# Patient Record
Sex: Male | Born: 1965 | Race: Black or African American | Hispanic: No | Marital: Single | Smoking: Never smoker
Health system: Southern US, Community
[De-identification: ages and names within clinical notes are randomized; demographics above are authoritative.]

## PROBLEM LIST (undated history)

## (undated) DIAGNOSIS — N289 Disorder of kidney and ureter, unspecified: Secondary | ICD-10-CM

## (undated) DIAGNOSIS — I2510 Atherosclerotic heart disease of native coronary artery without angina pectoris: Secondary | ICD-10-CM

## (undated) DIAGNOSIS — I10 Essential (primary) hypertension: Secondary | ICD-10-CM

## (undated) DIAGNOSIS — Z9989 Dependence on other enabling machines and devices: Secondary | ICD-10-CM

## (undated) DIAGNOSIS — G4733 Obstructive sleep apnea (adult) (pediatric): Secondary | ICD-10-CM

## (undated) DIAGNOSIS — J42 Unspecified chronic bronchitis: Secondary | ICD-10-CM

## (undated) DIAGNOSIS — E785 Hyperlipidemia, unspecified: Secondary | ICD-10-CM

## (undated) DIAGNOSIS — B20 Human immunodeficiency virus [HIV] disease: Secondary | ICD-10-CM

## (undated) DIAGNOSIS — I219 Acute myocardial infarction, unspecified: Secondary | ICD-10-CM

## (undated) DIAGNOSIS — N184 Chronic kidney disease, stage 4 (severe): Secondary | ICD-10-CM

## (undated) DIAGNOSIS — I5032 Chronic diastolic (congestive) heart failure: Secondary | ICD-10-CM

---

## 2012-06-20 DIAGNOSIS — I219 Acute myocardial infarction, unspecified: Secondary | ICD-10-CM

## 2012-06-20 HISTORY — DX: Acute myocardial infarction, unspecified: I21.9

## 2012-06-20 HISTORY — PX: CORONARY ANGIOPLASTY WITH STENT PLACEMENT: SHX49

## 2015-11-11 ENCOUNTER — Emergency Department: Payer: Medicare Other

## 2015-11-11 ENCOUNTER — Emergency Department
Admission: EM | Admit: 2015-11-11 | Discharge: 2015-11-11 | Disposition: A | Discharge status: Home / Self Care | Payer: Medicare Other | Attending: Emergency Medicine | Admitting: Emergency Medicine

## 2015-11-11 ENCOUNTER — Emergency Department (EMERGENCY_DEPARTMENT_HOSPITAL)
Admission: EM | Admit: 2015-11-11 | Discharge: 2015-11-11 | Disposition: A | Discharge status: Home / Self Care | Payer: Medicare Other | Source: Home / Self Care | Attending: Emergency Medicine | Admitting: Emergency Medicine

## 2015-11-11 DIAGNOSIS — I2510 Atherosclerotic heart disease of native coronary artery without angina pectoris: Secondary | ICD-10-CM | POA: Insufficient documentation

## 2015-11-11 DIAGNOSIS — R600 Localized edema: Secondary | ICD-10-CM | POA: Insufficient documentation

## 2015-11-11 DIAGNOSIS — R609 Edema, unspecified: Secondary | ICD-10-CM

## 2015-11-11 DIAGNOSIS — I252 Old myocardial infarction: Secondary | ICD-10-CM | POA: Insufficient documentation

## 2015-11-11 DIAGNOSIS — R079 Chest pain, unspecified: Secondary | ICD-10-CM | POA: Insufficient documentation

## 2015-11-11 HISTORY — DX: Atherosclerotic heart disease of native coronary artery without angina pectoris: I25.10

## 2015-11-11 HISTORY — DX: Disorder of kidney and ureter, unspecified: N28.9

## 2015-11-11 LAB — CBC AND DIFFERENTIAL
Basophils Absolute Automated: 0.02 10*3/uL (ref 0.00–0.20)
Basophils Absolute Automated: 0.02 10*3/uL (ref 0.00–0.20)
Basophils Automated: 1 %
Basophils Automated: 1 %
Eosinophils Absolute Automated: 0.15 10*3/uL (ref 0.00–0.70)
Eosinophils Absolute Automated: 0.11 10*3/uL (ref 0.00–0.70)
Eosinophils Automated: 5 %
Eosinophils Automated: 3 %
Hematocrit: 32.1 % — ABNORMAL LOW (ref 42.0–52.0)
Hematocrit: 33.2 % — ABNORMAL LOW (ref 42.0–52.0)
Hgb: 11 g/dL — ABNORMAL LOW (ref 13.0–17.0)
Hgb: 10.9 g/dL — ABNORMAL LOW (ref 13.0–17.0)
Immature Granulocytes Absolute: 0 10*3/uL
Immature Granulocytes Absolute: 0 10*3/uL
Immature Granulocytes: 0 %
Immature Granulocytes: 0 %
Lymphocytes Absolute Automated: 0.98 10*3/uL (ref 0.50–4.40)
Lymphocytes Absolute Automated: 1.17 10*3/uL (ref 0.50–4.40)
Lymphocytes Automated: 30 %
Lymphocytes Automated: 36 %
MCH: 29.5 pg (ref 28.0–32.0)
MCH: 28.4 pg (ref 28.0–32.0)
MCHC: 34.3 g/dL (ref 32.0–36.0)
MCHC: 32.8 g/dL (ref 32.0–36.0)
MCV: 86.1 fL (ref 80.0–100.0)
MCV: 86.5 fL (ref 80.0–100.0)
MPV: 11.1 fL (ref 9.4–12.3)
MPV: 9.7 fL (ref 9.4–12.3)
Monocytes Absolute Automated: 0.26 10*3/uL (ref 0.00–1.20)
Monocytes Absolute Automated: 0.29 10*3/uL (ref 0.00–1.20)
Monocytes: 8 %
Monocytes: 9 %
Neutrophils Absolute: 1.82 10*3/uL (ref 1.80–8.10)
Neutrophils Absolute: 1.63 10*3/uL — ABNORMAL LOW (ref 1.80–8.10)
Neutrophils: 56 %
Neutrophils: 51 %
Nucleated RBC: 0 /100 WBC (ref 0–1)
Nucleated RBC: 0 /100 WBC (ref 0–1)
Platelets: 171 10*3/uL (ref 140–400)
Platelets: 149 10*3/uL (ref 140–400)
RBC: 3.73 10*6/uL — ABNORMAL LOW (ref 4.70–6.00)
RBC: 3.84 10*6/uL — ABNORMAL LOW (ref 4.70–6.00)
RDW: 15 % (ref 12–15)
RDW: 15 % (ref 12–15)
WBC: 3.23 10*3/uL — ABNORMAL LOW (ref 3.50–10.80)
WBC: 3.22 10*3/uL — ABNORMAL LOW (ref 3.50–10.80)

## 2015-11-11 LAB — BASIC METABOLIC PANEL
BUN: 24 mg/dL (ref 9.0–28.0)
BUN: 22 mg/dL (ref 9.0–28.0)
CO2: 21 mEq/L — ABNORMAL LOW (ref 22–29)
CO2: 24 mEq/L (ref 22–29)
Calcium: 8.6 mg/dL (ref 8.5–10.5)
Calcium: 8.7 mg/dL (ref 8.5–10.5)
Chloride: 110 mEq/L (ref 100–111)
Chloride: 111 mEq/L (ref 100–111)
Creatinine: 1.6 mg/dL — ABNORMAL HIGH (ref 0.7–1.3)
Creatinine: 1.5 mg/dL — ABNORMAL HIGH (ref 0.7–1.3)
Glucose: 95 mg/dL (ref 70–100)
Glucose: 111 mg/dL — ABNORMAL HIGH (ref 70–100)
Potassium: 5.3 mEq/L — ABNORMAL HIGH (ref 3.5–5.1)
Potassium: 4.8 mEq/L (ref 3.5–5.1)
Sodium: 136 mEq/L (ref 136–145)
Sodium: 138 mEq/L (ref 136–145)

## 2015-11-11 LAB — ECG 12-LEAD
Atrial Rate: 71 {beats}/min
P Axis: 16 degrees
P-R Interval: 178 ms
Q-T Interval: 364 ms
QRS Duration: 84 ms
QTC Calculation (Bezet): 395 ms
R Axis: 47 degrees
T Axis: 60 degrees
Ventricular Rate: 71 {beats}/min

## 2015-11-11 LAB — B-TYPE NATRIURETIC PEPTIDE
B-Natriuretic Peptide: 235 pg/mL — ABNORMAL HIGH (ref 0–100)
B-Natriuretic Peptide: 232 pg/mL — ABNORMAL HIGH (ref 0–100)

## 2015-11-11 LAB — GFR
EGFR: 45.9
EGFR: 59.9

## 2015-11-11 LAB — I-STAT TROPONIN: i-STAT Troponin: 0.02 ng/mL (ref 0.00–0.09)

## 2015-11-11 LAB — IHS D-DIMER: D-Dimer: 4.5 ug/mL FEU — ABNORMAL HIGH (ref 0.00–0.49)

## 2015-11-11 MED ORDER — FUROSEMIDE 20 MG PO TABS
20.0000 mg | ORAL_TABLET | Freq: Every day | ORAL | Status: AC
Start: 2015-11-11 — End: ?

## 2015-11-11 NOTE — ED Notes (Signed)
Pt becoming aggitated, requesting to speak to MD. RN explained to pt MD will be by to see pt as soon as possible, MD currently in w/ critical pt at this time. Pt states "but I don't care, I was here before them". Pt also informed awaiting [place] results at this time. Prior to interaction pt talking on phone & drinking soda, no apparent distress noted. VSS.

## 2015-11-11 NOTE — ED Notes (Signed)
Bed: S A  Expected date:   Expected time:   Means of arrival:   Comments:

## 2015-11-11 NOTE — Discharge Instructions (Signed)
Dear Mr. Franklin:    Thank you for choosing the [facility] Emergency Department, the premier emergency department in the [place] area.  I hope your visit today was EXCELLENT.    Specific instructions for your visit today:    Take Lasix, 1 tablet per day for leg swelling.  Please follow up with Dr. Erlene Senters for reevaluation.  Return to the ER for worsening swelling, shortness of breath, or fever.    Peripheral Edema NOS     You were diagnosed with peripheral edema.    Peripheral edema happens when a part of your body, most often your legs, starts to get swollen. The liquid that is supposed to be in your veins and arteries leaks out of them. It then enters the space between your muscles and fat cells. Peripheral edema is often painless, but it can be uncomfortable if the skin gets too tightly stretched.     A diagnosis of peripheral edema comes from a physical exam made by a doctor. Many things can cause peripheral edema. The most common are:     Congestive heart failure (inability of the heart to pump enough blood to your body).   Damage to your kidneys.   Injuries to a body part.   Liver failure.   Certain medicines.   Malnutrition (not eating enough nutritious food).    Pregnancy.     You can also get peripheral edema when you have been sitting or standing still for a long time. This can happen when you are in the hospital, during surgery, or during long car and plane trips. Your doctor might have done some blood work to see how your heart, liver, and kidneys are working. The doctor might have also done a chest x-ray or checked the pumping of your heart (EKG).     Swelling in the arms and hands is rare. This is often a sign of a more serious problem. Some examples are:      A squeezing of the superior vena cava. This is the big vein in your chest that gets blood from your head and arms.   A liver problem that lowers the amount of protein in your blood.    The tests done today did not  find a clear cause for your problem. You will need more tests to figure out what the cause is.    You are safe to go home. It is important to take medicines exactly as you are instructed.     We don't believe your condition is serious right now. But, it is important to be careful. Sometimes a problem that seems small can get serious later. This is why it is very important to come back here or go to the nearest Emergency Department if you don't get better or if your symptoms get worse.    When you get home, take all medicines as you were instructed. Avoid salt and extra fluids if your peripheral edema is caused by your heart. If your peripheral edema is serious, you might have been started on a diuretic. A diuretic, also called a "water pill," is a medicine that helps the kidneys remove extra water from your body.     Keep all appointments with your doctor. Following up with your doctor or the referral doctor is very important.    YOU SHOULD SEEK MEDICAL ATTENTION IMMEDIATELY, EITHER HERE OR AT THE NEAREST EMERGENCY DEPARTMENT, IF ANY OF THE FOLLOWING OCCUR:     You have vomiting (throwing up)  that stops you from keeping any fluids in your stomach.   You have a faster heart rate. You feel lightheaded, or you pass out (lose consciousness).   You are vomiting blood or you have serious chest pain after vomiting.   You have a fever (temperature higher than 100.2F or 38C), chills, or abdominal (belly) pain.    If you can't talk with your doctor, or if you feel you need to be rechecked, come back here or go to the nearest emergency department.      If you do not continue to improve or your condition worsens, please contact your doctor or return immediately to the Emergency Department.    Sincerely,  Druckenbrod, Channing Mutters, MD  Attending Emergency Physician  [facility] Emergency Department    ONSITE PHARMACY  Our full service onsite pharmacy is located in the ER waiting room.  Open 7 days a week from  9 am to 11 pm.  We accept all major insurances and prices are competitive with major retailers.  Ask your provider to print your prescriptions down to the pharmacy to speed you on your way home.    OBTAINING A PRIMARY CARE APPOINTMENT    Primary care physicians (PCPs, also known as primary care doctors) are either internists or family medicine doctors. Both types of PCPs focus on health promotion, disease prevention, patient education and counseling, and treatment of acute and chronic medical conditions.    Call for an appointment with a primary care doctor.  Ask to see who is taking new patients.     [facility]  telephone:  [phone]  [email or web address]    DOCTOR REFERRALS  Call [phone] (available 24 hours a day, 7 days a week) if you need any further referrals and we can help you find a primary care doctor or specialist.  Also, available online at:  [email or web address]    YOUR CONTACT INFORMATION  Before leaving please check with registration to make sure we have an up-to-date contact number.  You can call registration at [phone] to update your information.  For questions about your hospital bill, please call [phone].  For questions about your Emergency Dept Physician bill please call [phone].      FREE HEALTH SERVICES  If you need help with health or social services, please call 2-1-1 for a free referral to resources in your area.  2-1-1 is a free service connecting people with information on health insurance, free clinics, pregnancy, mental health, dental care, food assistance, housing, and substance abuse counseling.  Also, available online at:  http://www.[place].org    MEDICAL RECORDS AND TESTS  Certain laboratory test results do not come back the same day, for example urine cultures.   We will contact you if other important findings are noted.  Radiology films are often reviewed again to ensure accuracy.  If there is any discrepancy, we will notify  you.      Please call [phone] to pick up a complimentary CD of any radiology studies performed.  If you or your doctor would like to request a copy of your medical records, please call [phone].      ORTHOPEDIC INJURY   Please know that significant injuries can exist even when an initial x-ray is read as normal or negative.  This can occur because some fractures (broken bones) are not initially visible on x-rays.  For this reason, close outpatient follow-up with your primary care doctor or bone specialist (orthopedist) is required.    MEDICATIONS  AND FOLLOWUP  Please be aware that some prescription medications can cause drowsiness.  Use caution when driving or operating machinery.    The examination and treatment you have received in our Emergency Department is provided on an emergency basis, and is not intended to be a substitute for your primary care physician.  It is important that your doctor checks you again and that you report any new or remaining problems at that time.      24 HOUR PHARMACIES  The nearest 24 hour pharmacy is:    [facility]  [address]  [place], [place] [address]  [phone]      ASSISTANCE WITH INSURANCE    Affordable Care Act  [facility])  Call to start or finish an application, compare plans, enroll or ask a question.  [phone]  TTY: [phone]  Web:  Healthcare.gov    Help Enrolling in [facility]  [place]  407-669-6355 (TOLL-FREE)  657-882-4836 (TTY)  Web:  Http://www.coverva.org    Local Help Enrolling in the [facility]  [place] [facility]  [phone] (MAIN)  Email:  health-help@nvfs .org  Web:  BlackjackMyths.is  Address:  [address] [place], [place] [address]    SEDATING MEDICATIONS  Sedating medications include strong pain medications (e.g. narcotics), muscle relaxers, benzodiazepines (used for anxiety and as muscle relaxers), Benadryl/diphenhydramine and other antihistamines for allergic reactions/itching, and  other medications.  If you are unsure if you have received a sedating medication, please ask your physician or nurse.  If you received a sedating medication: DO NOT drive a car. DO NOT operate machinery. DO NOT perform jobs where you need to be alert.  DO NOT drink alcoholic beverages while taking this medicine.     If you get dizzy, sit or lie down at the first signs. Be careful going up and down stairs.  Be extra careful to prevent falls.     Never give this medicine to others.     Keep this medicine out of reach of children.     Do not take or save old medicines. Throw them away when outdated.     Keep all medicines in a cool, dry place. DO NOT keep them in your bathroom medicine cabinet or in a cabinet above the stove.    MEDICATION REFILLS  Please be aware that we cannot refill any prescriptions through the ER. If you need further treatment from what is provided at your ER visit, please follow up with your primary care doctor or your pain management specialist.    FREESTANDING EMERGENCY DEPARTMENTS OF [facility]  Did you know [facility] has two freestanding ERs located just a few miles away?  [facility] and [facility] [place] have short wait times, easy free parking directly in front of the building and top patient satisfaction scores - and the same Board Certified Emergency Medicine doctors as [facility].              HUNNER GARCON  [id number]  [id number]  [phone]  11/11/2015    Discharge Instructions    As always, you are the most important factor in your recovery.  Please follow these instructions carefully.  If you have problems that we have not discussed, CALL OR VISIT YOUR DOCTOR RIGHT AWAY.     If you can't reach your doctor, return to the emergency department.    I Horton Finer understand the written and discussed instructions.  My questions have been answered.  I acknowledge receipt of these instructions.     Patient or responsible person:          Patient's Signature               Physician or Nurse

## 2015-11-11 NOTE — Discharge Instructions (Signed)
Take 1 Aspirin daily.     Chest Pain of Unclear Etiology    You have been seen for chest pain. The cause of your pain is not yet known.    Your doctor has learned about your medical history, examined you, and checked any tests that were done. Still, it is unclear why you are having pain. The doctor thinks there is only a very small chance that your pain is caused by a life-threatening condition. Later, your primary care doctor might do more tests or check you again.    Sometimes chest pain is caused by a dangerous condition, like a heart attack, aorta injury, blood clot in the lung, or collapsed lung. It is unlikely that your pain is caused by a life-threatening condition if: Your chest pain lasts only a few seconds at a time; you are not short of breath, nauseated (sick to your stomach), sweaty, or lightheaded; your pain gets worse when you twist or bend; your pain improves with exercise or hard work.    Chest pain is serious. It is VERY IMPORTANT that you follow up with your regular doctor and seek medical attention immediately here or at the nearest Emergency Department if your symptoms become worse or they change.    YOU SHOULD SEEK MEDICAL ATTENTION IMMEDIATELY, EITHER HERE OR AT THE NEAREST EMERGENCY DEPARTMENT, IF ANY OF THE FOLLOWING OCCURS:   Your pain gets worse.   Your pain makes you short of breath, nauseated, or sweaty.   Your pain gets worse when you walk, go up stairs, or exert yourself.   You feel weak, lightheaded, or faint.   It hurts to breathe.   Your leg swells.   Your symptoms get worse or you have new symptoms or concerns.          Dear [name]:    Thank you for choosing the [facility] Emergency Department, the premier emergency department in the [place] area.  I hope your visit today was EXCELLENT.    Specific instructions for your visit today:        If you do not continue to improve or your condition worsens, please contact your doctor or return immediately to the  Emergency Department.    Sincerely,  Zachary Bradshaw, Zachary Bradshaw  Attending Emergency Physician  [facility] Emergency Department    ONSITE PHARMACY  Our full service onsite pharmacy is located in the ER waiting room.  Open 7 days a week from 9 am to 11 pm.  We accept all major insurances and prices are competitive with major retailers.  Ask your provider to print your prescriptions down to the pharmacy to speed you on your way home.    OBTAINING A PRIMARY CARE APPOINTMENT    Primary care physicians (PCPs, also known as primary care doctors) are either internists or family medicine doctors. Both types of PCPs focus on health promotion, disease prevention, patient education and counseling, and treatment of acute and chronic medical conditions.    Call for an appointment with a primary care doctor.  Ask to see who is taking new patients.     [facility]  telephone:  [phone]  [email or web address]    DOCTOR REFERRALS  Call [phone] (available 24 hours a day, 7 days a week) if you need any further referrals and we can help you find a primary care doctor or specialist.  Also, available online at:  [email or web address]    YOUR CONTACT INFORMATION  Before leaving please check with  registration to make sure we have an up-to-date contact number.  You can call registration at [phone] to update your information.  For questions about your hospital bill, please call [phone].  For questions about your Emergency Dept Physician bill please call [phone].      [place]  If you need help with health or social services, please call 2-1-1 for a free referral to resources in your area.  2-1-1 is a free service connecting people with information on health insurance, free clinics, pregnancy, mental health, dental care, food assistance, housing, and substance abuse counseling.  Also, available online at:  http://www.[place].org    MEDICAL RECORDS AND TESTS  Certain  laboratory test results do not come back the same day, for example urine cultures.   We will contact you if other important findings are noted.  Radiology films are often reviewed again to ensure accuracy.  If there is any discrepancy, we will notify you.      Please call [phone] to pick up a complimentary CD of any radiology studies performed.  If you or your doctor would like to request a copy of your medical records, please call [phone].      ORTHOPEDIC INJURY   Please know that significant injuries can exist even when an initial x-ray is read as normal or negative.  This can occur because some fractures (broken bones) are not initially visible on x-rays.  For this reason, close outpatient follow-up with your primary care doctor or bone specialist (orthopedist) is required.    MEDICATIONS AND FOLLOWUP  Please be aware that some prescription medications can cause drowsiness.  Use caution when driving or operating machinery.    The examination and treatment you have received in our Emergency Department is provided on an emergency basis, and is not intended to be a substitute for your primary care physician.  It is important that your doctor checks you again and that you report any new or remaining problems at that time.      [place]  The nearest 24 hour pharmacy is:    [facility] [place] [place] [address]  [place] Act  [facility])  Call to start or finish an application, compare plans, enroll or ask a question.  Speculator: [phone]  Web:  Healthcare.gov    Help Enrolling in [place]  949-644-6173 (TOLL-FREE)  [phone] (TTY)  Web:  Http://www.coverva.org    Local Help Enrolling in the [place]  217-763-4965 (MAIN)  Email:  health-help_0 .org  Web:  http://lewis-perez.info/  Address:  [address], [address] [place], [place]  [address]    SEDATING MEDICATIONS  Sedating medications include strong pain medications (e.g. narcotics), muscle relaxers, benzodiazepines (used for anxiety and as muscle relaxers), Benadryl/diphenhydramine and other antihistamines for allergic reactions/itching, and other medications.  If you are unsure if you have received a sedating medication, please ask your physician or nurse.  If you received a sedating medication: DO NOT drive a car. DO NOT operate machinery. DO NOT perform jobs where you need to be alert.  DO NOT drink alcoholic beverages while taking this medicine.     If you get dizzy, sit or lie down at the first signs. Be careful going up and down stairs.  Be extra careful to prevent falls.     Never give this  medicine to others.     Keep this medicine out of reach of children.     Do not take or save old medicines. Throw them away when outdated.     Keep all medicines in a cool, dry place. DO NOT keep them in your bathroom medicine cabinet or in a cabinet above the stove.    MEDICATION REFILLS  Please be aware that we cannot refill any prescriptions through the ER. If you need further treatment from what is provided at your ER visit, please follow up with your primary care doctor or your pain management specialist.    FREESTANDING EMERGENCY DEPARTMENTS OF [facility]  Did you know [facility] has two freestanding ERs located just a few miles away?  [place] [facility] [place] and [place] [facility] [place] have short wait times, easy free parking directly in front of the building and top patient satisfaction scores - and the same Board Certified Emergency Medicine doctors as [facility].              Zachary Bradshaw  [id number]  [id number]  [phone]  11/11/2015    Discharge Instructions    As always, you are the most important factor in your recovery.  Please follow these instructions carefully.  If you have problems that we have not discussed, CALL OR VISIT YOUR DOCTOR RIGHT AWAY.     If you  can't reach your doctor, return to the emergency department.    I Zachary Bradshaw understand the written and discussed instructions.  My questions have been answered.  I acknowledge receipt of these instructions.     Patient or responsible person:         Patient's Signature               Physician or Nurse

## 2015-11-11 NOTE — ED Notes (Signed)
I have briefly evaluated this patient as triage physician in order to facilitate and initiate the ordering of laboratory and imaging studies as needed.      Latanya Presser, MD  11/11/15 [phone]

## 2015-11-11 NOTE — ED Provider Notes (Signed)
Physician/Midlevel provider first contact with patient: 11/11/15 0909             History     Chief Complaint   Patient presents with   . Leg Swelling       HPI  Zachary Bradshaw is a 50 y.o. male h/o chronic bilateral LE swelling and pain, gout, renal failure (7 years ago, never underwent dialysis), MI s/p cardiac catheterization (2015), alcohol abuse (staying at the [facility] for the next 6 months); c/o bilateral LE swelling x2 days. He reports onset of L big toe pain yesterday, came here for further eval. Denies h/o CHF.      History  Past Medical History   Diagnosis Date   . Kidney disorder    . Coronary artery disease        History reviewed. No pertinent past surgical history.    No family history on file.    Social History     Social History   . Marital Status: Single     Spouse Name: N/A   . Number of Children: N/A   . Years of Education: N/A     Occupational History   . Not on file.     Social History Main Topics   . Smoking status: Never Smoker    . Smokeless tobacco: Not on file   . Alcohol Use: No   . Drug Use: Not on file   . Sexual Activity: Not on file     Other Topics Concern   . Not on file     Social History Narrative   . No narrative on file       Allergies   Allergen Reactions   . Sulfa Antibiotics    . Zithromax [Azithromycin]        Discharge Medication List as of 11/11/2015  1:58 PM      CONTINUE these medications which have NOT CHANGED    Details   LISINOPRIL PO Take by mouth., Until Discontinued, Historical Med              ROS  Positive and negative ROS elements as per HPI.  All other systems reviewed and negative.    Interpretations  O2 sat-           saturation: 99 %; Oxygen use: room air; Interpretation: Normal    EKG -             interpreted by me: NSR at 71, nl EKG.    Physical Exam    Pulse 95  BP (!) 128/91 mmHg  Resp 16  SpO2 99 %  Temp 97.2 F (36.2 C)    Physical Exam   Constitutional: He is oriented to person, place, and time and well-developed, well-nourished, and  in no distress. No distress.   HENT:   Head: Normocephalic and atraumatic.   Mouth/Throat: Oropharynx is clear and moist. No oropharyngeal exudate.   Eyes: Conjunctivae and EOM are normal. Pupils are equal, round, and reactive to light. Right eye exhibits no discharge. Left eye exhibits no discharge.   Neck: Normal range of motion. Neck supple. No JVD present.   Cardiovascular: Normal rate and regular rhythm.    No murmur heard.  Pulmonary/Chest: Effort normal and breath sounds normal. No respiratory distress. He has no wheezes.   Abdominal: Soft. Bowel sounds are normal. He exhibits no distension. There is no tenderness. There is no rebound.   Musculoskeletal:   Bilat 1-2+ pitting edema in [place].  Mild  erythema and tend at L MCP.  Sensory intact.   Neurological: He is alert and oriented to person, place, and time. No cranial nerve deficit. Coordination normal. GCS score is 15.   Skin: Skin is warm and dry. No rash noted. He is not diaphoretic. No erythema.   Psychiatric: Affect and judgment normal.       MDM and ED Course     Results     Procedure Component Value Units Date/Time    B-type Natriuretic Peptide [[id number]]  (Abnormal) Collected:  11/11/15 0951    Specimen Information:  Blood Updated:  11/11/15 1057     B-Natriuretic Peptide 235 (H) pg/mL     D-Dimer [[id number]]  (Abnormal) Collected:  11/11/15 0951     D-Dimer 4.50 (H) ug/mL FEU Updated:  11/11/15 1054    Basic Metabolic Panel [[id number]]  (Abnormal) Collected:  11/11/15 0951    Specimen Information:  Blood Updated:  11/11/15 1029     Glucose 95 mg/dL      BUN 08.6 mg/dL      Creatinine 1.6 (H) mg/dL      Calcium 8.6 mg/dL      Sodium 578 mEq/L      Potassium 5.3 (H) mEq/L      Chloride 110 mEq/L      CO2 21 (L) mEq/L     GFR [[id number]] Collected:  11/11/15 0951     EGFR 45.9 Updated:  11/11/15 1029    CBC with differential [[id number]]  (Abnormal) Collected:  11/11/15 0951    Specimen Information:  Blood from Blood Updated:  11/11/15 1011     WBC 3.23  (L) x10 3/uL      Hgb 11.0 (L) g/dL      Hematocrit 27.2 (L) %      Platelets 171 x10 3/uL      RBC 3.73 (L) x10 6/uL      MCV 86.1 fL      MCH 29.5 pg      MCHC 34.3 g/dL      RDW 15 %      MPV 11.1 fL      Neutrophils 56 %      Lymphocytes Automated 30 %      Monocytes 8 %      Eosinophils Automated 5 %      Basophils Automated 1 %      Immature Granulocyte 0 %      Nucleated RBC 0 /100 WBC      Neutrophils Absolute 1.82 x10 3/uL      Abs Lymph Automated 0.98 x10 3/uL      Abs Mono Automated 0.26 x10 3/uL      Abs Eos Automated 0.15 x10 3/uL      Absolute Baso Automated 0.02 x10 3/uL      Absolute Immature Granulocyte 0.00 x10 3/uL           [place] Venodopp Low Extremity Bilateral    11/11/2015   No evidence for DVT. Charlott Rakes, MD 11/11/2015 1:28 PM       Medications - No data to display    MDM  Mild periph edema with mild BNP elevation and cret.  DW Thathagari.  Begin low lasix and follow with her.  Dopplers negative.  NO CP or SOB  11:39 AM: Spoke w/ Dr Erlene Senters, aware of pt. Will follow up in outpatient.    Procedures      Clinical Impression & Disposition     Final diagnoses:  Peripheral edema          Discharge         Discharge Prescriptions     Medication Sig Dispense Auth. Provider    furosemide (LASIX) 20 MG tablet Take 1 tablet (20 mg total) by mouth daily. 14 tablet Lylia Karn, Channing Mutters, MD          Discharge Medication List as of 11/11/2015  1:58 PM      START taking these medications    Details   furosemide (LASIX) 20 MG tablet Take 1 tablet (20 mg total) by mouth daily., Starting 11/11/2015, Until Discontinued, Print             Attestations  I was acting as a Neurosurgeon for Orvan Papadakis, Channing Mutters, MD on [name],[name]  Treatment Team: Scribe: Valrie Hart    I am the first provider for this patient and I personally performed the services documented. Treatment Team: Scribe: Valrie Hart is scribing for me on [name],[name], Channing Mutters, MD    Rajiv Parlato, Channing Mutters, MD  11/12/15 [phone]

## 2015-11-11 NOTE — ED Provider Notes (Signed)
[facility] EMERGENCY DEPARTMENT H&P      Visit date: 11/11/2015      CLINICAL SUMMARY          Diagnosis:    .     Final diagnoses:   Chest pain, unspecified type         MDM Notes:      Chest pain - ill defined - over 4 hours ago - not suspicious for ACS - no associated symptoms.  'Will check labs, CXR, EKG.             Disposition:         Discharge         Discharge Prescriptions     None                      CLINICAL INFORMATION        HPI:      Chief Complaint: Chest Pain  .    Zachary Bradshaw is a 50 y.o. male with h/o gout, MI (2 years ago with catheterization, w/o stents) who presents with L sided CP as well as swelling and pain in his BLE onset 2 days prior. Her reports that sxs were similar to previous MI. Seen here earlier today for gout,  And then had some chest pain after going back to [facility] sevreal hours ago.  Sober for 2 weeks since he arrived at the [facility].  Hx MI 2 years ago but ? No stents and not taking any medication at this time.  NO Hx HTN, DM, cholesterol, is smoker.    Denies associated SOB, nausea, or diaphoresis.   Denies taking any cardiac medicine.     Of note, pt is staying at the [facility] for EtOH abuse. Admits to cocaine use two weeks ago, but denies heroin use.     History obtained from: Patient          ROS:      Positive and negative ROS elements as per HPI.  All other systems reviewed and negative.      Physical Exam:      Pulse 74  BP 136/74 mmHg  Resp 16  SpO2 100 %  Temp 98.4 F (36.9 C)    Physical Exam   Constitutional: He is oriented to person, place, and time. He appears well-developed and well-nourished.   WD male - alert, NAD   HENT:   Head: Normocephalic and atraumatic.   Neck: Normal range of motion. Neck supple. No JVD present. No tracheal deviation present. No thyromegaly present.   Cardiovascular: Normal rate, regular rhythm and intact distal pulses.  Exam reveals no gallop and no friction rub.    No  murmur heard.  Soft S4  No murmur   Pulmonary/Chest: Effort normal and breath sounds normal. No stridor. No respiratory distress. He has no wheezes. He has no rales. He exhibits no tenderness.   Clear bilat.   Abdominal: Soft. Bowel sounds are normal. He exhibits no distension and no mass. There is no tenderness. There is no rebound and no guarding.   Musculoskeletal: Normal range of motion. He exhibits edema. He exhibits no tenderness.   1 plus edema lower legs.     Lymphadenopathy:     He has no cervical adenopathy.   Neurological: He is alert and oriented to person, place, and time. No cranial nerve deficit. He exhibits normal muscle tone. Coordination normal.   Skin: Skin is warm. No rash noted.  No erythema. No pallor.   Psychiatric: He has a normal mood and affect. His behavior is normal. Judgment and thought content normal.   Nursing note and vitals reviewed.                 PAST HISTORY        Primary Care Provider: Patsy Lager, MD        PMH/PSH:    .     Past Medical History   Diagnosis Date   . Kidney disorder    . Coronary artery disease        He has no past surgical history on file.      Social/Family History:      He reports that he has never smoked. He does not have any smokeless tobacco history on file. He reports that he does not drink alcohol. His drug history is not on file.    History reviewed. No pertinent family history.      Listed Medications on Arrival:    .     Home Medications     Last Medication Reconciliation Action:  In Progress Abu-Sharakh, Nur-Elhuda, RN 11/11/2015  6:22 PM                  furosemide (LASIX) 20 MG tablet     Take 1 tablet (20 mg total) by mouth daily.     LISINOPRIL PO     Take by mouth.         Allergies: He is allergic to sulfa antibiotics and zithromax.            VISIT INFORMATION        Clinical Course in the ED:            Medications Given in the ED:    .     ED Medication Orders     None            Procedures:      Procedures      Interpretations:      O2  sat-           saturation: 100 %; Oxygen use: room air; Interpretation: Normal    EKG -             interpreted by me: Sinus rhythm at 76 bpm, no acute changes       Heart Score         Value    History  0    EKG  0    Risk Factors  0    Total (with age)  1    Onset of pain (time of START of last episode of chest pain)?  3-6 hrs ago    Timing of repeat Troponin/EKG order  6 hours after onset of pain (time of START of last episode)                 RESULTS        Lab Results:      Results     Procedure Component Value Units Date/Time    B-type Natriuretic Peptide (BNP) [[id number]]  (Abnormal) Collected:  11/11/15 2031    Specimen Information:  Blood Updated:  11/11/15 2107     B-Natriuretic Peptide 232 (H) pg/mL     Basic Metabolic Panel [[id number]]  (Abnormal) Collected:  11/11/15 1947    Specimen Information:  Blood Updated:  11/11/15 2013     Glucose 111 (H) mg/dL      BUN  22.0 mg/dL      Creatinine 1.5 (H) mg/dL      Calcium 8.7 mg/dL      Sodium 161 mEq/L      Potassium 4.8 mEq/L      Chloride 111 mEq/L      CO2 24 mEq/L     GFR [[id number]] Collected:  11/11/15 1947     EGFR 59.9 Updated:  11/11/15 2013    CBC with differential [[id number]]  (Abnormal) Collected:  11/11/15 1947    Specimen Information:  Blood from Blood Updated:  11/11/15 2003     WBC 3.22 (L) x10 3/uL      Hgb 10.9 (L) g/dL      Hematocrit 95.6 (L) %      Platelets 149 x10 3/uL      RBC 3.84 (L) x10 6/uL      MCV 86.5 fL      MCH 28.4 pg      MCHC 32.8 g/dL      RDW 15 %      MPV 9.7 fL      Neutrophils 51 %      Lymphocytes Automated 36 %      Monocytes 9 %      Eosinophils Automated 3 %      Basophils Automated 1 %      Immature Granulocyte 0 %      Nucleated RBC 0 /100 WBC      Neutrophils Absolute 1.63 (L) x10 3/uL      Abs Lymph Automated 1.17 x10 3/uL      Abs Mono Automated 0.29 x10 3/uL      Abs Eos Automated 0.11 x10 3/uL      Absolute Baso Automated 0.02 x10 3/uL      Absolute Immature Granulocyte 0.00 x10 3/uL     i-Stat Troponin  [[id number]] Collected:  11/11/15 1941     i-STAT Troponin 0.02 ng/mL Updated:  11/11/15 1958              Radiology Results:      XR Chest  AP Portable   Final Result         Mild cardiomegaly, otherwise no acute cardiopulmonary process      J. Carole Binning, MD    11/11/2015 7:27 PM                     Scribe Attestation:      I was acting as a Neurosurgeon for Maurine Minister, MD on Corum,Jarris A  Treatment Team: Scribe: Linford Arnold     I am the first provider for this patient and I personally performed the services documented. Treatment Team: Scribe: Linford Arnold is scribing for me on Meals,Bentley A. This note and the patient instructions accurately reflect work and decisions made by me.  Maurine Minister, MD       Note - labs show mildly elevated BNP, mild cardiomegaly, and edema - will refer to transitional care clinic for further evaluation, possible echo, and further treatment.  I do not suspect acute aCS at this time.    Maurine Minister, MD  11/14/15 1447

## 2015-11-11 NOTE — ED Notes (Signed)
Patient BIBA for chest pain, left sided. Took two nitro with no relief. 12 lead negative. Patient seen here and discharged a couple hours ago for gout.

## 2015-11-12 LAB — ECG 12-LEAD
Atrial Rate: 76 {beats}/min
P Axis: 0 degrees
P-R Interval: 172 ms
Q-T Interval: 356 ms
QRS Duration: 84 ms
QTC Calculation (Bezet): 400 ms
R Axis: 12 degrees
T Axis: 35 degrees
Ventricular Rate: 76 {beats}/min

## 2017-03-15 ENCOUNTER — Encounter (HOSPITAL_COMMUNITY): Payer: Self-pay | Admitting: *Deleted

## 2017-03-15 ENCOUNTER — Inpatient Hospital Stay (HOSPITAL_COMMUNITY)
Admission: AD | Admit: 2017-03-15 | Discharge: 2017-03-17 | DRG: 177 | Source: Other Acute Inpatient Hospital | Attending: Internal Medicine | Admitting: Internal Medicine

## 2017-03-15 DIAGNOSIS — Z7982 Long term (current) use of aspirin: Secondary | ICD-10-CM

## 2017-03-15 DIAGNOSIS — J9811 Atelectasis: Secondary | ICD-10-CM | POA: Diagnosis present

## 2017-03-15 DIAGNOSIS — R0902 Hypoxemia: Secondary | ICD-10-CM | POA: Diagnosis present

## 2017-03-15 DIAGNOSIS — I130 Hypertensive heart and chronic kidney disease with heart failure and stage 1 through stage 4 chronic kidney disease, or unspecified chronic kidney disease: Secondary | ICD-10-CM | POA: Diagnosis present

## 2017-03-15 DIAGNOSIS — Z955 Presence of coronary angioplasty implant and graft: Secondary | ICD-10-CM

## 2017-03-15 DIAGNOSIS — N179 Acute kidney failure, unspecified: Secondary | ICD-10-CM | POA: Diagnosis present

## 2017-03-15 DIAGNOSIS — D61818 Other pancytopenia: Secondary | ICD-10-CM | POA: Diagnosis present

## 2017-03-15 DIAGNOSIS — J189 Pneumonia, unspecified organism: Secondary | ICD-10-CM | POA: Diagnosis present

## 2017-03-15 DIAGNOSIS — E785 Hyperlipidemia, unspecified: Secondary | ICD-10-CM | POA: Diagnosis present

## 2017-03-15 DIAGNOSIS — Z8701 Personal history of pneumonia (recurrent): Secondary | ICD-10-CM

## 2017-03-15 DIAGNOSIS — I252 Old myocardial infarction: Secondary | ICD-10-CM

## 2017-03-15 DIAGNOSIS — N189 Chronic kidney disease, unspecified: Secondary | ICD-10-CM

## 2017-03-15 DIAGNOSIS — F17210 Nicotine dependence, cigarettes, uncomplicated: Secondary | ICD-10-CM | POA: Diagnosis present

## 2017-03-15 DIAGNOSIS — Z7902 Long term (current) use of antithrombotics/antiplatelets: Secondary | ICD-10-CM

## 2017-03-15 DIAGNOSIS — J869 Pyothorax without fistula: Secondary | ICD-10-CM | POA: Diagnosis present

## 2017-03-15 DIAGNOSIS — R079 Chest pain, unspecified: Secondary | ICD-10-CM | POA: Diagnosis present

## 2017-03-15 DIAGNOSIS — I2510 Atherosclerotic heart disease of native coronary artery without angina pectoris: Secondary | ICD-10-CM | POA: Diagnosis present

## 2017-03-15 DIAGNOSIS — I10 Essential (primary) hypertension: Secondary | ICD-10-CM | POA: Diagnosis not present

## 2017-03-15 DIAGNOSIS — N183 Chronic kidney disease, stage 3 (moderate): Secondary | ICD-10-CM | POA: Diagnosis present

## 2017-03-15 DIAGNOSIS — B20 Human immunodeficiency virus [HIV] disease: Secondary | ICD-10-CM | POA: Diagnosis present

## 2017-03-15 DIAGNOSIS — Z882 Allergy status to sulfonamides status: Secondary | ICD-10-CM

## 2017-03-15 DIAGNOSIS — D649 Anemia, unspecified: Secondary | ICD-10-CM | POA: Diagnosis present

## 2017-03-15 DIAGNOSIS — R161 Splenomegaly, not elsewhere classified: Secondary | ICD-10-CM | POA: Diagnosis present

## 2017-03-15 DIAGNOSIS — I5032 Chronic diastolic (congestive) heart failure: Secondary | ICD-10-CM | POA: Diagnosis present

## 2017-03-15 HISTORY — DX: Human immunodeficiency virus (HIV) disease: B20

## 2017-03-15 HISTORY — DX: Acute myocardial infarction, unspecified: I21.9

## 2017-03-15 LAB — TROPONIN I

## 2017-03-15 LAB — LACTIC ACID, PLASMA: Lactic Acid, Venous: 2.9 mmol/L (ref 0.5–1.9)

## 2017-03-15 LAB — PROCALCITONIN: Procalcitonin: <0.1 ng/mL

## 2017-03-15 MED ORDER — GUAIFENESIN ER 600 MG PO TB12
600.0000 mg | ORAL_TABLET | Freq: Two times a day (BID) | ORAL | Status: DC
  Administered 2017-03-15 – 2017-03-17 (×4): 600 mg via ORAL
  Filled 2017-03-15 (×4): qty 1

## 2017-03-15 MED ORDER — MORPHINE SULFATE (PF) 4 MG/ML IV SOLN
4.0000 mg | INTRAVENOUS | Status: AC
  Administered 2017-03-15: 4 mg via INTRAVENOUS
  Filled 2017-03-15: qty 1

## 2017-03-15 MED ORDER — HYDROCODONE-ACETAMINOPHEN 5-325 MG PO TABS
1.0000 | ORAL_TABLET | ORAL | Status: DC | PRN
  Administered 2017-03-16 (×2): 2 via ORAL
  Filled 2017-03-15 (×2): qty 2

## 2017-03-15 MED ORDER — IPRATROPIUM-ALBUTEROL 0.5-2.5 (3) MG/3ML IN SOLN: 3.0000 mL | RESPIRATORY_TRACT | Status: DC | PRN

## 2017-03-15 MED ORDER — CEFTRIAXONE SODIUM 2 G IJ SOLR
2.0000 g | INTRAMUSCULAR | Status: DC
  Administered 2017-03-15 – 2017-03-16 (×2): 2 g via INTRAVENOUS
  Filled 2017-03-15 (×3): qty 2

## 2017-03-15 MED ORDER — ACETAMINOPHEN 650 MG RE SUPP: 650.0000 mg | Freq: Four times a day (QID) | RECTAL | Status: DC | PRN

## 2017-03-15 MED ORDER — MORPHINE SULFATE (PF) 4 MG/ML IV SOLN
2.0000 mg | INTRAVENOUS | Status: DC | PRN
  Administered 2017-03-16 (×2): 2 mg via INTRAVENOUS
  Filled 2017-03-15 (×2): qty 1

## 2017-03-15 MED ORDER — SODIUM CHLORIDE 0.9 % IV BOLUS (SEPSIS)
500.0000 mL | Freq: Once | INTRAVENOUS | Status: AC
  Administered 2017-03-15: 500 mL via INTRAVENOUS

## 2017-03-15 MED ORDER — SODIUM CHLORIDE 0.9 % IV SOLN
INTRAVENOUS | Status: DC
  Administered 2017-03-15 – 2017-03-17 (×4): via INTRAVENOUS

## 2017-03-15 MED ORDER — ACETAMINOPHEN 325 MG PO TABS: 650.0000 mg | ORAL_TABLET | Freq: Four times a day (QID) | ORAL | Status: DC | PRN

## 2017-03-15 MED ORDER — ENOXAPARIN SODIUM 40 MG/0.4ML ~~LOC~~ SOLN
40.0000 mg | SUBCUTANEOUS | Status: DC
  Administered 2017-03-15 – 2017-03-16 (×2): 40 mg via SUBCUTANEOUS
  Filled 2017-03-15 (×2): qty 0.4

## 2017-03-15 NOTE — Progress Notes (Signed)
51 year old male with a history of HIV being transferred from [facility] ED for recurrent pneumonia suspected to be PCP pneumonia. Patient will need infectious disease evaluation

## 2017-03-15 NOTE — Progress Notes (Signed)
While doing nursing admission hx, pt requests that no mention of hiv status be made in front of prison guards or anyone in his room. He states he does not trust the prison guards. He told me this when guard stepped out into hall to change shifts with the oncoming guard. Lucius Conn BSN, RN-BC Admissions RN 03/15/2017 6:48 PM

## 2017-03-15 NOTE — Progress Notes (Signed)
LCSW aware of consult: patient from jail with planned disposition back to jail.  At this time, no interventions required by LCSW regarding this disposition.  Officers at bedside will facilitate DC.    Chart reviewed and aware of confidentiality of 042 requested: While doing nursing admission hx, pt requests that nothing be said about his hiv status in front of prison guards or anyone in his room. He states he does not trust the prison guards. Pt told me this when guard stepped out into hall to change shifts with oncoming guard.   Will alert on coming CSW for AM and make aware of planned disposition.  LCSW will follow acutely in case needs are warranted, however at this time none needed.  Joseph Quinn, MSW Clinical Social Work: [employer] Coverage for :  [phone]

## 2017-03-15 NOTE — Progress Notes (Addendum)
CRITICAL VALUE ALERT  Critical Value:  Lactic acid 2.9  Date & Time Notied:  03/15/2017  2103  Provider Notified: Lamar Blinks NP  Orders Received/Actions taken: 520mL NS Bolus

## 2017-03-15 NOTE — Progress Notes (Signed)
Pharmacy Antibiotic Note  Joseph Quinn is a 51 y.o. male with hx recurrent PNA, transferred from [facility] ED to [facility] ED on 03/15/2017 for evaluation and management of PNA.  To start ceftriaxone for suspected empyema.   Plan: - ceftriaxone 2gm IV q24h - no renal adjustment is needed with ceftriaxone-- pharmacy will sign off   ---------------------------------------------  Height: 5\' 8"  (172.7 cm) Weight: 156 lb 15.5 oz (71.2 kg) IBW/kg (Calculated) : 68.4  Temp (24hrs), Avg:99.4 F (37.4 C), Min:99.4 F (37.4 C), Max:99.4 F (37.4 C)  No results for input(s): WBC, CREATININE, LATICACIDVEN, VANCOTROUGH, VANCOPEAK, VANCORANDOM, GENTTROUGH, GENTPEAK, GENTRANDOM, TOBRATROUGH, TOBRAPEAK, TOBRARND, AMIKACINPEAK, AMIKACINTROU, AMIKACIN in the last 168 hours.  CrCl cannot be calculated (No order found.).    Allergies  Allergen Reactions  . Sulfa Antibiotics     Pt states it made him go into coma    Thank you for allowing pharmacy to be a part of this patient's care.  Lynelle Doctor 03/15/2017 8:12 PM

## 2017-03-15 NOTE — H&P (Addendum)
History and Physical    Bertrand Vowels GNF:[id number] DOB: 1966/01/19 DOA: 03/15/2017  Referring MD/NP/PA: Dr. Allyson Sabal PCP: No primary care provider on file.  Patient coming from: Correctional facility to [place] transferred to [facility] Complaint: Chest pain  HPI: Joseph Quinn is a 51 y.o. male with medical history significant of HIV/aids( last CD4 count noted to be 114), HTN, HLD, CAD s/p stent, chronic diastolic CHF; who presents with complaints of right-sided chest pain since last night at around 10 PM. Patient describes the pain as severe worsened with any inspiratory breath. Endorses having a cough for the last few days, but has not been able to cough anything up due to the pain. Associated symptoms include shortness of breath, nausea, nonbloody emesis 2 episodes, and diarrhea reporting more from 5-6, missed the last 3-4 days.  He had been hospitalized and diagnosed at [facility] with strep pneumonia bacteremia secondary to right lower lobe pneumonia on 9/4, and had been treated with high-dose Rocephin with vancomycin for 8 days and transitioned to Levaquin at discharge. Blood cultures were noted to have cleared and sensitivities to cultures were noted to be adequate. He was also started on dapsone during that visit for PCP prophylaxis due to his low CD4 counts. A transthoracic echocardiogram was obtained showed EF of approximately 55%. However, after being discharged back to prison patient reports not receiving these medications. He subsequently developed recurrent fevers and was admitted back into the hospital on 9/11- 9/17. At that time it appears blood cultures were negative, but he was started on Levaquin and prednisone and her fevers resolved in the first 24 hours. He underwent repeat transthoracic echocardiogram because note of a new murmur on physical exam previously noted. as well as transesophageal echocardiogram which appeared to be similar to previous  echocardiogram and no vegetation noted. Dr. Randall Hiss of [facility] ID was consulted and recommended changing antibiotics from Levaquin to Rocephin 2 g daily and continue dapsone. Patient completed 14 days of antibiotic therapy for bacteremia on 9/17, and was discharged back to the facility. Subsequently, patient was evaluated on 9/24 with CT angiogram at [facility] in [place] show no signs of a pulmonary embolus. Due to his recurrent symptoms of chest pain he was evaluated at [facility] where a CT angiogram was obtained and revealed a mildly loculated right pleural effusion noted posteriorly and 4 mm nodule in the left upper lobe. Labs revealed WBC 3.3, hemoglobin 10.8, hematocrit 33.0,   ED Course: As seen above  Review of Systems  Constitutional: Positive for malaise/fatigue. Negative for weight loss.  HENT: Negative for ear discharge and ear pain.   Eyes: Negative for double vision and photophobia.  Respiratory: Positive for cough and shortness of breath. Negative for hemoptysis.   Cardiovascular: Positive for chest pain. Negative for leg swelling.  Gastrointestinal: Positive for diarrhea, nausea and vomiting. Negative for blood in stool and constipation.  Genitourinary: Negative for dysuria and frequency.  Musculoskeletal: Positive for back pain. Negative for falls.  Skin: Negative for itching and rash.  Neurological: Negative for seizures and loss of consciousness.  Psychiatric/Behavioral: Negative for memory loss and substance abuse.    Past Medical History:  Diagnosis Date  . HIV (human immunodeficiency virus infection) ([place])   . Myocardial infarction ([place])    hx of 2 mi's per pt    Past Surgical History:  Procedure Laterality Date  . NO PAST SURGERIES       reports that he has been smoking Cigarettes.  He has a 10.00 pack-year smoking history. He has never used smokeless tobacco. He reports that he drinks alcohol. He reports that he does not use drugs.  Allergies  Allergen  Reactions  . Sulfa Antibiotics     Pt states it made him go into coma    History reviewed. No pertinent family history.  Prior to Admission medications   Not on File    Physical Exam:  Constitutional: NAD, calm, comfortable Vitals:   03/15/17 1745  BP: 140/86  Pulse: 74  Resp: 16  Temp: 99.4 F (37.4 C)  TempSrc: Oral  SpO2: 97%  Weight: 71.2 kg (156 lb 15.5 oz)  Height: 5\' 8"  (1.727 m)   Eyes: PERRL, lids and conjunctivae normal ENMT: Mucous membranes are moist. Posterior pharynx clear of any exudate or lesions.Normal dentition.  Neck: normal, supple, no masses, no thyromegaly Respiratory: Decreased overall aeration especially noted in the right lower lung field. Patient able to talk in complete sentences. Cardiovascular: Regular rate and rhythm, no murmurs / rubs / gallops. No extremity edema. 2+ pedal pulses. No carotid bruits.  Abdomen: no tenderness, no masses palpated. No hepatosplenomegaly. Bowel sounds positive.  Musculoskeletal: no clubbing / cyanosis. No joint deformity upper and lower extremities. Good ROM, no contractures. Normal muscle tone.  Skin: no rashes, lesions, ulcers. No induration Neurologic: CN 2-12 grossly intact. Sensation intact, DTR normal. Strength 5/5 in all 4.  Psychiatric: Normal judgment and insight. Alert and oriented x 3. Normal mood.     Labs on Admission: I have personally reviewed following labs and imaging studies  CBC: No results for input(s): WBC, NEUTROABS, HGB, HCT, MCV, PLT in the last 168 hours. Basic Metabolic Panel: No results for input(s): NA, K, CL, CO2, GLUCOSE, BUN, CREATININE, CALCIUM, MG, PHOS in the last 168 hours. GFR: CrCl cannot be calculated (No order found.). Liver Function Tests: No results for input(s): AST, ALT, ALKPHOS, BILITOT, PROT, ALBUMIN in the last 168 hours. No results for input(s): LIPASE, AMYLASE in the last 168 hours. No results for input(s): AMMONIA in the last 168 hours. Coagulation  Profile: No results for input(s): INR, PROTIME in the last 168 hours. Cardiac Enzymes: No results for input(s): CKTOTAL, CKMB, CKMBINDEX, TROPONINI in the last 168 hours. BNP (last 3 results) No results for input(s): PROBNP in the last 8760 hours. HbA1C: No results for input(s): HGBA1C in the last 72 hours. CBG: No results for input(s): GLUCAP in the last 168 hours. Lipid Profile: No results for input(s): CHOL, HDL, LDLCALC, TRIG, CHOLHDL, LDLDIRECT in the last 72 hours. Thyroid Function Tests: No results for input(s): TSH, T4TOTAL, FREET4, T3FREE, THYROIDAB in the last 72 hours. Anemia Panel: No results for input(s): VITAMINB12, FOLATE, FERRITIN, TIBC, IRON, RETICCTPCT in the last 72 hours. Urine analysis: No results found for: COLORURINE, APPEARANCEUR, LABSPEC, PHURINE, GLUCOSEU, HGBUR, BILIRUBINUR, KETONESUR, PROTEINUR, UROBILINOGEN, NITRITE, LEUKOCYTESUR Sepsis Labs: No results found for this or any previous visit (from the past 240 hour(s)).   Radiological Exams on Admission: No results found.  EKG: Independently reviewed. 1st degree Av block  Assessment/Plan Right-sided chest pain 2/2 Suspected Empyema: Acute. Patient complains of right sided chest pain especially with taking a deep inspiratory breath. CT angiogram at outside facility seen and chart reveals mild loculated right pleural effusion posteriorly with possible atelectasis on the right lower lung lobe. Patient was given 1 dose of Zosyn prior to transport. Given recent history of strep pneumonia pna with bacteremia question if symptoms related. - Admit to a telemetry bed -  Follow-up blood and sputum(if able to be obtained)cultures  - Rocephin 2 g daily per ID recommendation - Dr. Bobby Rumpf of infectious disease consulted, and will see in a.m.  Dyspnea and hypoxia: Acute. Patient reports having shortness of breath. initial ABG noted to be compensated with pH 7.36, the CO2 38, PO2 77.  - Continuous pulse oximetry  with nasal Oxygen as needed. - Duonebs prn SOB/Cough - Mucinex  HIV (human immunodeficiency virus infection): As noted CD4 count was 114, CD4 helper percentage 28.5 -continue darunavir, Tivicay, Norvir, and Descovy - Infectious disease consulted, follow-up for further recommendations    Pancytopenia: Patient presented to outside facility today and noted to have WBC 3.3, and hemoglobin 10.8. - check Lactic acid level - Recheck CBC in a.m.    Essential hypertension - Continue amlodipine and coreg  Possible Acute kidney injury on chronic kidney disease: Patient's last creatinine seen on records was previously 1.6 on 9/16. He presents with a creatinine of 1.8 on admission with BUN 23. - Check renal ultrasound - IV fluids normal saline at 062 ml/hr  Diastolic CHF: Last EF noted to be within normal limits 02/2017. - Strict ins and outs and daily weights  Coronary artery disease: Patient s/p stent placement   - continue Plavix and ASA  Hyperlipidemia - continue pravastatin  Tobacco abuse: Patient endorses still smoking cigarettes and declines nicotine patch. - Counseled on need of cessation of tobacco.   DVT prophylaxis: lovenox  Code Status: Full Family Communication: No family members present in room, only guard Disposition Plan: Likely discharge back to correctional facility once medically stable  Consults called: ID  Admission status: inpatient  Norval Morton MD [facility] Pager [phone]   If 7PM-7AM, please contact night-coverage [email or web address] Password [email or web address]  03/15/2017, 7:18 PM

## 2017-03-16 ENCOUNTER — Other Ambulatory Visit: Payer: Self-pay

## 2017-03-16 ENCOUNTER — Inpatient Hospital Stay (HOSPITAL_COMMUNITY)

## 2017-03-16 DIAGNOSIS — Z7982 Long term (current) use of aspirin: Secondary | ICD-10-CM

## 2017-03-16 DIAGNOSIS — Z79899 Other long term (current) drug therapy: Secondary | ICD-10-CM

## 2017-03-16 DIAGNOSIS — N189 Chronic kidney disease, unspecified: Secondary | ICD-10-CM

## 2017-03-16 DIAGNOSIS — D649 Anemia, unspecified: Secondary | ICD-10-CM | POA: Diagnosis present

## 2017-03-16 DIAGNOSIS — J869 Pyothorax without fistula: Principal | ICD-10-CM

## 2017-03-16 DIAGNOSIS — E785 Hyperlipidemia, unspecified: Secondary | ICD-10-CM | POA: Diagnosis present

## 2017-03-16 DIAGNOSIS — Z882 Allergy status to sulfonamides status: Secondary | ICD-10-CM

## 2017-03-16 DIAGNOSIS — J189 Pneumonia, unspecified organism: Secondary | ICD-10-CM

## 2017-03-16 DIAGNOSIS — I2510 Atherosclerotic heart disease of native coronary artery without angina pectoris: Secondary | ICD-10-CM | POA: Diagnosis present

## 2017-03-16 DIAGNOSIS — Z21 Asymptomatic human immunodeficiency virus [HIV] infection status: Secondary | ICD-10-CM

## 2017-03-16 DIAGNOSIS — F17210 Nicotine dependence, cigarettes, uncomplicated: Secondary | ICD-10-CM

## 2017-03-16 DIAGNOSIS — B20 Human immunodeficiency virus [HIV] disease: Secondary | ICD-10-CM

## 2017-03-16 DIAGNOSIS — I10 Essential (primary) hypertension: Secondary | ICD-10-CM | POA: Diagnosis present

## 2017-03-16 DIAGNOSIS — R161 Splenomegaly, not elsewhere classified: Secondary | ICD-10-CM

## 2017-03-16 DIAGNOSIS — J13 Pneumonia due to Streptococcus pneumoniae: Secondary | ICD-10-CM

## 2017-03-16 DIAGNOSIS — R079 Chest pain, unspecified: Secondary | ICD-10-CM | POA: Diagnosis present

## 2017-03-16 DIAGNOSIS — N179 Acute kidney failure, unspecified: Secondary | ICD-10-CM | POA: Diagnosis present

## 2017-03-16 LAB — MRSA PCR SCREENING: MRSA BY PCR: NEGATIVE

## 2017-03-16 LAB — COMPREHENSIVE METABOLIC PANEL
ALBUMIN: 2.4 g/dL — AB (ref 3.5–5.0)
ALT: 11 U/L — ABNORMAL LOW (ref 17–63)
ANION GAP: 6 (ref 5–15)
AST: 17 U/L (ref 15–41)
Alkaline Phosphatase: 91 U/L (ref 38–126)
BILIRUBIN TOTAL: 0.2 mg/dL — AB (ref 0.3–1.2)
BUN: 23 mg/dL — ABNORMAL HIGH (ref 6–20)
CO2: 21 mmol/L — AB (ref 22–32)
Calcium: 8.5 mg/dL — ABNORMAL LOW (ref 8.9–10.3)
Chloride: 108 mmol/L (ref 101–111)
Creatinine, Ser: 1.65 mg/dL — ABNORMAL HIGH (ref 0.61–1.24)
GFR calc Af Amer: 54 mL/min — ABNORMAL LOW (ref >60)
GFR calc non Af Amer: 47 mL/min — ABNORMAL LOW (ref >60)
GLUCOSE: 166 mg/dL — AB (ref 65–99)
POTASSIUM: 5.3 mmol/L — AB (ref 3.5–5.1)
SODIUM: 135 mmol/L (ref 135–145)
TOTAL PROTEIN: 6.8 g/dL (ref 6.5–8.1)

## 2017-03-16 LAB — CBC
HCT: 26.9 % — ABNORMAL LOW (ref 39.0–52.0)
Hemoglobin: 8.9 g/dL — ABNORMAL LOW (ref 13.0–17.0)
MCH: 25.8 pg — ABNORMAL LOW (ref 26.0–34.0)
MCHC: 33.1 g/dL (ref 30.0–36.0)
MCV: 78 fL (ref 78.0–100.0)
Platelets: 173 10*3/uL (ref 150–400)
RBC: 3.45 MIL/uL — ABNORMAL LOW (ref 4.22–5.81)
RDW: 15.6 % — AB (ref 11.5–15.5)
WBC: 2.5 10*3/uL — ABNORMAL LOW (ref 4.0–10.5)

## 2017-03-16 LAB — STREP PNEUMONIAE URINARY ANTIGEN: STREP PNEUMO URINARY ANTIGEN: POSITIVE — AB

## 2017-03-16 LAB — LACTIC ACID, PLASMA
Lactic Acid, Venous: 1.8 mmol/L (ref 0.5–1.9)
Lactic Acid, Venous: 3.3 mmol/L (ref 0.5–1.9)

## 2017-03-16 MED ORDER — SACCHAROMYCES BOULARDII 250 MG PO CAPS
250.0000 mg | ORAL_CAPSULE | Freq: Two times a day (BID) | ORAL | Status: DC
  Administered 2017-03-16 – 2017-03-17 (×3): 250 mg via ORAL
  Filled 2017-03-16 (×3): qty 1

## 2017-03-16 MED ORDER — HYDROCERIN EX CREA
1.0000 "application " | TOPICAL_CREAM | Freq: Every day | CUTANEOUS | Status: DC
  Filled 2017-03-16: qty 113

## 2017-03-16 MED ORDER — CLOPIDOGREL BISULFATE 75 MG PO TABS
75.0000 mg | ORAL_TABLET | Freq: Every day | ORAL | Status: DC
  Administered 2017-03-16 – 2017-03-17 (×2): 75 mg via ORAL
  Filled 2017-03-16 (×2): qty 1

## 2017-03-16 MED ORDER — EMTRICITABINE-TENOFOVIR AF 200-25 MG PO TABS
1.0000 | ORAL_TABLET | Freq: Every day | ORAL | Status: DC
  Administered 2017-03-16 – 2017-03-17 (×2): 1 via ORAL
  Filled 2017-03-16 (×2): qty 1

## 2017-03-16 MED ORDER — DIPHENHYDRAMINE HCL 25 MG PO CAPS
25.0000 mg | ORAL_CAPSULE | ORAL | Status: DC | PRN
  Administered 2017-03-16 – 2017-03-17 (×3): 25 mg via ORAL
  Filled 2017-03-16 (×3): qty 1

## 2017-03-16 MED ORDER — DIPHENHYDRAMINE HCL 50 MG PO CAPS
50.0000 mg | ORAL_CAPSULE | Freq: Once | ORAL | Status: AC
  Administered 2017-03-16: 50 mg via ORAL
  Filled 2017-03-16: qty 1

## 2017-03-16 MED ORDER — LACTULOSE 10 GM/15ML PO SOLN
30.0000 g | Freq: Once | ORAL | Status: AC
  Administered 2017-03-16: 30 g via ORAL
  Filled 2017-03-16: qty 45

## 2017-03-16 MED ORDER — ALUM & MAG HYDROXIDE-SIMETH 200-200-20 MG/5ML PO SUSP
30.0000 mL | ORAL | Status: DC | PRN
Start: 2017-03-16 — End: 2017-03-17
  Administered 2017-03-16 (×3): 30 mL via ORAL
  Filled 2017-03-16 (×3): qty 30

## 2017-03-16 MED ORDER — CARVEDILOL 6.25 MG PO TABS
6.2500 mg | ORAL_TABLET | Freq: Two times a day (BID) | ORAL | Status: DC
  Administered 2017-03-16 – 2017-03-17 (×3): 6.25 mg via ORAL
  Filled 2017-03-16 (×3): qty 1

## 2017-03-16 MED ORDER — AMLODIPINE BESYLATE 5 MG PO TABS
5.0000 mg | ORAL_TABLET | Freq: Every day | ORAL | Status: DC
  Administered 2017-03-16 – 2017-03-17 (×2): 5 mg via ORAL
  Filled 2017-03-16 (×2): qty 1

## 2017-03-16 MED ORDER — HYDROCORTISONE 2.5 % RE CREA
1.0000 "application " | TOPICAL_CREAM | Freq: Two times a day (BID) | RECTAL | Status: DC | PRN
  Filled 2017-03-16: qty 28.35

## 2017-03-16 MED ORDER — RITONAVIR 100 MG PO TABS
100.0000 mg | ORAL_TABLET | Freq: Every day | ORAL | Status: DC
  Administered 2017-03-16 – 2017-03-17 (×2): 100 mg via ORAL
  Filled 2017-03-16 (×2): qty 1

## 2017-03-16 MED ORDER — SODIUM CHLORIDE 0.9 % IV BOLUS (SEPSIS)
500.0000 mL | Freq: Once | INTRAVENOUS | Status: AC
  Administered 2017-03-16: 500 mL via INTRAVENOUS

## 2017-03-16 MED ORDER — DOLUTEGRAVIR SODIUM 50 MG PO TABS
50.0000 mg | ORAL_TABLET | Freq: Two times a day (BID) | ORAL | Status: DC
  Administered 2017-03-16 – 2017-03-17 (×3): 50 mg via ORAL
  Filled 2017-03-16 (×3): qty 1

## 2017-03-16 MED ORDER — FERROUS SULFATE 325 (65 FE) MG PO TABS
325.0000 mg | ORAL_TABLET | Freq: Every day | ORAL | Status: DC
  Administered 2017-03-16 – 2017-03-17 (×2): 325 mg via ORAL
  Filled 2017-03-16 (×2): qty 1

## 2017-03-16 MED ORDER — ASPIRIN EC 81 MG PO TBEC
81.0000 mg | DELAYED_RELEASE_TABLET | Freq: Every day | ORAL | Status: DC
Start: 2017-03-16 — End: 2017-03-17
  Administered 2017-03-16 – 2017-03-17 (×2): 81 mg via ORAL
  Filled 2017-03-16 (×2): qty 1

## 2017-03-16 MED ORDER — PRAVASTATIN SODIUM 40 MG PO TABS
40.0000 mg | ORAL_TABLET | Freq: Every day | ORAL | Status: DC
  Administered 2017-03-16: 40 mg via ORAL
  Filled 2017-03-16: qty 1

## 2017-03-16 MED ORDER — DARUNAVIR ETHANOLATE 800 MG PO TABS
800.0000 mg | ORAL_TABLET | Freq: Every day | ORAL | Status: DC
  Administered 2017-03-16 – 2017-03-17 (×2): 800 mg via ORAL
  Filled 2017-03-16 (×2): qty 1

## 2017-03-16 MED ORDER — CAMPHOR-MENTHOL 0.5-0.5 % EX LOTN
1.0000 "application " | TOPICAL_LOTION | Freq: Every day | CUTANEOUS | Status: DC
  Administered 2017-03-16 – 2017-03-17 (×2): 1 via TOPICAL
  Filled 2017-03-16: qty 222

## 2017-03-16 MED ORDER — DAPSONE 100 MG PO TABS
100.0000 mg | ORAL_TABLET | Freq: Every day | ORAL | Status: DC
  Administered 2017-03-16 – 2017-03-17 (×2): 100 mg via ORAL
  Filled 2017-03-16 (×2): qty 1

## 2017-03-16 NOTE — Progress Notes (Addendum)
CRITICAL VALUE ALERT  Critical Value: Lactic acid 3.3  Date & Time Notied:  03/16/2017  0008  Provider Notified: Lamar Blinks NP  Orders Received/Actions taken: 500 mL bolus

## 2017-03-16 NOTE — Progress Notes (Signed)
PROGRESS NOTE    Joseph Quinn  BTD:[id number] DOB: 1965-12-23 DOA: 03/15/2017 PCP: Patient, No Pcp Per    Brief Narrative:  51 y.o. male with medical history significant of HIV/aids( last CD4 count noted to be 114), HTN, HLD, CAD s/p stent, chronic diastolic CHF; who presents with complaints of right-sided chest pain since last night at around 10 PM. Patient describes the pain as severe worsened with any inspiratory breath. Endorses having a cough for the last few days, but has not been able to cough anything up due to the pain. Associated symptoms include shortness of breath, nausea, nonbloody emesis 2 episodes, and diarrhea reporting more from 5-6, missed the last 3-4 days.  He had been hospitalized and diagnosed at [facility] with strep pneumonia bacteremia secondary to right lower lobe pneumonia on 9/4, and had been treated with high-dose Rocephin with vancomycin for 8 days and transitioned to Levaquin at discharge. Blood cultures were noted to have cleared and sensitivities to cultures were noted to be adequate. He was also started on dapsone during that visit for PCP prophylaxis due to his low CD4 counts. A transthoracic echocardiogram was obtained showed EF of approximately 55%. However, after being discharged back to prison patient reports not receiving these medications. He subsequently developed recurrent fevers and was admitted back into the hospital on 9/11- 9/17. At that time it appears blood cultures were negative, but he was started on Levaquin and prednisone and her fevers resolved in the first 24 hours. He underwent repeat transthoracic echocardiogram because note of a new murmur on physical exam previously noted. as well as transesophageal echocardiogram which appeared to be similar to previous echocardiogram and no vegetation noted. Dr. Randall Hiss of [facility] ID was consulted and recommended changing antibiotics from Levaquin to Rocephin 2 g daily and continue dapsone. Patient  completed 14 days of antibiotic therapy for bacteremia on 9/17, and was discharged back to the facility. Subsequently, patient was evaluated on 9/24 with CT angiogram at [facility] in [place] show no signs of a pulmonary embolus. Due to his recurrent symptoms of chest pain he was evaluated at [facility] where a CT angiogram was obtained and revealed a mildly loculated right pleural effusion noted posteriorly and 4 mm nodule in the left upper lobe. Labs revealed WBC 3.3, hemoglobin 10.8, hematocrit 33.0  Assessment & Plan:   Principal Problem:   Empyema, right (HCC) Active Problems:   HIV (human immunodeficiency virus infection) ([place])   Recurrent pneumonia   Right-sided chest pain   Acute kidney injury superimposed on chronic kidney disease ([place])  Right-sided chest pain 2/2 Suspected Empyema: Acute. Patient complains of right sided chest pain especially with taking a deep inspiratory breath. CT angiogram at outside facility seen and chart reveals mild loculated right pleural effusion posteriorly with possible atelectasis on the right lower lung lobe. Patient was given 1 dose of Zosyn prior to transport. - Follow-up on pan cultures - Patient is continued on Rocephin 2 g daily per ID recommendation - Case was discussed with ID at time of admit  Dyspnea and hypoxia: Acute. Patient reports having shortness of breath. initial ABG noted to be compensated with pH 7.36, the CO2 38, PO2 77.  - Continuous pulse oximetry with nasal Oxygen as needed. - Will continue with Duonebs prn SOB/Cough - Cont with PRN mucinex   HIV (human immunodeficiency virus infection): As noted CD4 count was 114, CD4 helper percentage 28.5 - Will continue with darunavir, Tivicay, Norvir, and Descovy - Infectious disease  consulted, follow-up for further recommendations    Pancytopenia: Patient presented to outside facility today and noted to have WBC 3.3, and hemoglobin 10.8. - check Lactic acid level - Recheck  CBC in a.m.    Essential hypertension - Will continue on amlodipine and coreg  Possible Acute kidney injury on chronic kidney disease: Patient's last creatinine seen on records was previously 1.6 on 9/16. He presents with a creatinine of 1.8 on admission with BUN 23. - Check renal ultrasound - IV fluids normal saline at 100 ml/hr - Repeat renal function in AM  Diastolic CHF: Last EF noted to be within normal limits 02/2017. - Continue with strict ins and outs and daily weights  Coronary artery disease: Patient s/p stent placement   - Continue Plavix and ASA  Hyperlipidemia - continue pravastatin  Tobacco abuse: Patient endorses still smoking cigarettes and declines nicotine patch. - Cessation was done.  DVT prophylaxis: Lovenox subq Code Status: Full Family Communication: Pt in room, family not at bedside Disposition Plan: Uncertain at this time  Consultants:   ID  Procedures:     Antimicrobials: Anti-infectives    Start     Dose/Rate Route Frequency Ordered Stop   03/16/17 1000  dapsone tablet 100 mg     100 mg Oral Daily 03/16/17 0445     03/16/17 1000  dolutegravir (TIVICAY) tablet 50 mg     50 mg Oral 2 times daily 03/16/17 0445     03/16/17 1000  emtricitabine-tenofovir AF (DESCOVY) 200-25 MG per tablet 1 tablet    Comments:  Take with Prezcobix.     1 tablet Oral Daily 03/16/17 0445     03/16/17 0800  darunavir (PREZISTA) tablet 800 mg    Comments:  Take with Norvir and with a full meal.     800 mg Oral Daily with breakfast 03/16/17 0445     03/16/17 0800  ritonavir (NORVIR) tablet 100 mg    Comments:  Take with Prezista and with a full meal daily.     100 mg Oral Daily with breakfast 03/16/17 0445     03/15/17 2030  cefTRIAXone (ROCEPHIN) 2 g in dextrose 5 % 50 mL IVPB     2 g 100 mL/hr over 30 Minutes Intravenous Every 24 hours 03/15/17 2004         Subjective: Without complaints at this time. Denies sob  Objective: Vitals:   03/15/17 2153  03/16/17 0624 03/16/17 0627 03/16/17 1500  BP: 139/85 (!) 146/82  134/76  Pulse: 96 85  67  Resp: 18 16  18   Temp: 98 F (36.7 C) 99.1 F (37.3 C)  98.1 F (36.7 C)  TempSrc: Oral Oral  Oral  SpO2: 94% 99%  96%  Weight:   73.9 kg (162 lb 14.7 oz)   Height:        Intake/Output Summary (Last 24 hours) at 03/16/17 1531 Last data filed at 03/16/17 1500  Gross per 24 hour  Intake             2885 ml  Output              250 ml  Net             2635 ml   Filed Weights   03/15/17 1745 03/16/17 0627  Weight: 71.2 kg (156 lb 15.5 oz) 73.9 kg (162 lb 14.7 oz)    Examination:  General exam: Appears calm and comfortable  Respiratory system: Clear to auscultation. Respiratory effort normal. Cardiovascular  system: S1 & S2 heard, RRR Gastrointestinal system: Abdomen is nondistended, soft and nontender. No organomegaly or masses felt. Normal bowel sounds heard. Central nervous system: Alert and oriented. No focal neurological deficits. Extremities: Symmetric 5 x 5 power. Skin: No rashes, lesions Psychiatry: Judgement and insight appear normal. Mood & affect appropriate.   Data Reviewed: I have personally reviewed following labs and imaging studies  CBC:  Recent Labs Lab 03/16/17 0518  WBC 2.5*  HGB 8.9*  HCT 26.9*  MCV 78.0  PLT 983   Basic Metabolic Panel:  Recent Labs Lab 03/16/17 0518  NA 135  K 5.3*  CL 108  CO2 21*  GLUCOSE 166*  BUN 23*  CREATININE 1.65*  CALCIUM 8.5*   GFR: Estimated Creatinine Clearance: 51.2 mL/min (A) (by C-G formula based on SCr of 1.65 mg/dL (H)). Liver Function Tests:  Recent Labs Lab 03/16/17 0518  AST 17  ALT 11*  ALKPHOS 91  BILITOT 0.2*  PROT 6.8  ALBUMIN 2.4*   No results for input(s): LIPASE, AMYLASE in the last 168 hours. No results for input(s): AMMONIA in the last 168 hours. Coagulation Profile: No results for input(s): INR, PROTIME in the last 168 hours. Cardiac Enzymes:  Recent Labs Lab 03/15/17 2132    TROPONINI <0.03   BNP (last 3 results) No results for input(s): PROBNP in the last 8760 hours. HbA1C: No results for input(s): HGBA1C in the last 72 hours. CBG: No results for input(s): GLUCAP in the last 168 hours. Lipid Profile: No results for input(s): CHOL, HDL, LDLCALC, TRIG, CHOLHDL, LDLDIRECT in the last 72 hours. Thyroid Function Tests: No results for input(s): TSH, T4TOTAL, FREET4, T3FREE, THYROIDAB in the last 72 hours. Anemia Panel: No results for input(s): VITAMINB12, FOLATE, FERRITIN, TIBC, IRON, RETICCTPCT in the last 72 hours. Sepsis Labs:  Recent Labs Lab 03/15/17 2027 03/15/17 2334 03/16/17 0518  PROCALCITON <0.10  --   --   LATICACIDVEN 2.9* 3.3* 1.8    Recent Results (from the past 240 hour(s))  Culture, blood (x 2)     Status: None (Preliminary result)   Collection Time: 03/15/17  8:27 PM  Result Value Ref Range Status   Specimen Description BLOOD LEFT HAND  Final   Special Requests IN PEDIATRIC BOTTLE Blood Culture adequate volume  Final   Culture   Final    NO GROWTH < 24 HOURS Performed at [facility], [place] [address] [place], [place] [address]    Report Status PENDING  Incomplete  Culture, blood (x 2)     Status: None (Preliminary result)   Collection Time: 03/15/17  8:27 PM  Result Value Ref Range Status   Specimen Description BLOOD LEFT HAND  Final   Special Requests   Final    BOTTLES DRAWN AEROBIC ONLY Blood Culture adequate volume   Culture   Final    NO GROWTH < 24 HOURS Performed at [facility], [place] [address] [place], [place] [address]    Report Status PENDING  Incomplete  MRSA PCR Screening     Status: None   Collection Time: 03/15/17  9:00 PM  Result Value Ref Range Status   MRSA by PCR NEGATIVE NEGATIVE Final    Comment:        The GeneXpert MRSA Assay (FDA approved for NASAL specimens only), is one component of a comprehensive MRSA colonization surveillance program. It is not intended to diagnose  MRSA infection nor to guide or monitor treatment for MRSA infections.  Radiology Studies: US Renal  Result Date: 03/16/2017 CLINICAL DATA:  Acute kidney injury, smoker EXAM: RENAL / URINARY TRACT ULTRASOUND COMPLETE COMPARISON:  None FINDINGS: Right Kidney: Length: 11.4 cm. Normal cortical thickness. Increased cortical echogenicity. No mass, hydronephrosis, or shadowing calcification. Left Kidney: Length: 10.9 cm. Normal cortical thickness. Increased cortical echogenicity. No mass, hydronephrosis or shadowing calcification. Bladder: Appears normal for degree of bladder distention. IMPRESSION: Medical renal disease changes of both kidneys. No evidence of renal mass or hydronephrosis. Electronically Signed   By: Lavonia Dana M.D.   On: 03/16/2017 08:25    Scheduled Meds: . amLODipine  5 mg Oral Daily  . aspirin EC  81 mg Oral Daily  . camphor-menthol  1 application Topical Daily  . carvedilol  6.25 mg Oral BID WC  . clopidogrel  75 mg Oral Daily  . dapsone  100 mg Oral Daily  . darunavir  800 mg Oral Q breakfast  . dolutegravir  50 mg Oral BID  . emtricitabine-tenofovir AF  1 tablet Oral Daily  . enoxaparin (LOVENOX) injection  40 mg Subcutaneous Q24H  . ferrous sulfate  325 mg Oral Q breakfast  . guaiFENesin  600 mg Oral BID  . hydrocerin  1 application Topical Daily  . pravastatin  40 mg Oral QHS  . ritonavir  100 mg Oral Q breakfast  . saccharomyces boulardii  250 mg Oral BID   Continuous Infusions: . sodium chloride 100 mL/hr at 03/16/17 0625  . cefTRIAXone (ROCEPHIN)  IV Stopped (03/15/17 2333)     LOS: 1 day   Taneika Choi, Orpah Melter, MD [facility] Pager [phone]  If 7PM-7AM, please contact night-coverage [email or web address] Password [facility] 03/16/2017, 3:31 PM

## 2017-03-16 NOTE — Care Management Note (Signed)
Case Management Note  Patient Details  Name: Joseph Quinn MRN: [id number] Date of Birth: Jul 30, 1965  Subjective/Objective: 51 y/o m admitted w/PNA. From [place]. Police in rm w/patient.                   Action/Plan:d/c return back to [place]   Expected Discharge Date:   (unknown)               Expected Discharge Plan:  [facility] Facility  In-House Referral:     Discharge planning Services  CM Consult  Post Acute Care Choice:    Choice offered to:     DME Arranged:    DME Agency:     HH Arranged:    HH Agency:     Status of Service:  In process, will continue to follow  If discussed at Long Length of Stay Meetings, dates discussed:    Additional Comments:  Dessa Phi, RN 03/16/2017, 1:22 PM

## 2017-03-16 NOTE — Consult Note (Signed)
[facility] for Infectious Disease    Date of Admission:  03/15/2017           Day 1 ceftriaxone       Reason for Consult: Persistent right pleural empyema following recent treatment for bacteremic pneumococcal pneumonia    Referring Provider: Dr. Levora Angel   Assessment: I suspect that he has a persistent right pleural empyema complicating his recent bacteremic pneumococcal pneumonia. I agree with restarting ceftriaxone. If he does well over the next few days I would consider switching him to oral amoxicillin and treat for at least 2 more weeks. I will continue his current antiretroviral regimen and dapsone and check repeat CD4 and HIV viral load.   Plan: 1. Continue ceftriaxone 2. Continue antiretroviral regimen and dapsone 3. Check CD4 count and HIV viral load   Principal Problem:   Empyema, right (HCC) Active Problems:   HIV (human immunodeficiency virus infection) ([place])   Right-sided chest pain   Acute kidney injury superimposed on chronic kidney disease (HCC)   Dyslipidemia   Hypertension   Coronary artery disease   Normocytic anemia   . amLODipine  5 mg Oral Daily  . aspirin EC  81 mg Oral Daily  . camphor-menthol  1 application Topical Daily  . carvedilol  6.25 mg Oral BID WC  . clopidogrel  75 mg Oral Daily  . dapsone  100 mg Oral Daily  . darunavir  800 mg Oral Q breakfast  . dolutegravir  50 mg Oral BID  . emtricitabine-tenofovir AF  1 tablet Oral Daily  . enoxaparin (LOVENOX) injection  40 mg Subcutaneous Q24H  . ferrous sulfate  325 mg Oral Q breakfast  . guaiFENesin  600 mg Oral BID  . hydrocerin  1 application Topical Daily  . pravastatin  40 mg Oral QHS  . ritonavir  100 mg Oral Q breakfast  . saccharomyces boulardii  250 mg Oral BID    HPI: Joseph Quinn is a 51 y.o. male who has been in prison for the last 10 months on a gun charge. He has had HIV infection for over 20 years. He has been followed by an infectious disease doctor  in [place]. He has taken antiretrovirals intermittently but not consistently over the past decade. After he was imprisoned last year he was told that his HIV regimen was not working and was started on a salvage regimen consisting of Descovy, Tivicay and Prezcobix along with prophylactic dapsone. He is not sure what follow-up lab work is shown.  He recently developed fever, cough, shortness of breath and chest pain and was admitted to an outside hospital. His records are incomplete but I think he was admitted on 02/22/2016. He was found to have pneumonia and pneumococcal bacteremia. He was treated with IV ceftriaxone and vancomycin. He recalls being in the hospital 5 days. He was supposed to be on oral levofloxacin after discharge but he says that he was not given any antibiotics when he returned to prison. He had a recurrent fever and chest pain and was readmitted on 02/28/2017 and started back on ceftriaxone. He was discharged on 03/06/2017 off of antibiotics. The evening before last he had sudden increase of his right-sided chest pain. It was pleuritic in nature. He has had a persistent dry cough. He has not had any recent fever, chills or sweats. He was taken to [facility] where a CT scan showed a small loculated right pleural effusion, basilar atelectasis and mild  splenomegaly before transfer here.   Review of Systems: Review of Systems  Constitutional: Positive for malaise/fatigue. Negative for chills, diaphoresis, fever and weight loss.  HENT: Negative for sore throat.   Respiratory: Positive for cough and shortness of breath. Negative for hemoptysis and sputum production.   Cardiovascular: Positive for chest pain.  Gastrointestinal: Negative for abdominal pain, diarrhea, heartburn, nausea and vomiting.  Genitourinary: Negative for dysuria and frequency.  Musculoskeletal: Negative for joint pain and myalgias.  Skin: Negative for rash.  Neurological: Negative for dizziness and headaches.   Psychiatric/Behavioral: Negative for depression and substance abuse. The patient is not nervous/anxious.        He has a past history of cigarette smoking, heavy alcohol and polysubstance use.    Past Medical History:  Diagnosis Date  . HIV (human immunodeficiency virus infection) ([place])   . Myocardial infarction (HCC)    hx of 2 mi's per pt    Social History  Substance Use Topics  . Smoking status: Current Every Day Smoker    Packs/day: 0.50    Years: 20.00    Types: Cigarettes  . Smokeless tobacco: Never Used  . Alcohol use Yes     Comment: case per week    History reviewed. No pertinent family history. Allergies  Allergen Reactions  . Sulfa Antibiotics     Pt states it made him go into coma    OBJECTIVE: Blood pressure 134/76, pulse 67, temperature 98.1 F (36.7 C), temperature source Oral, resp. rate 18, height 5\' 8"  (1.727 m), weight 162 lb 14.7 oz (73.9 kg), SpO2 96 %.  Physical Exam  Constitutional: He is oriented to person, place, and time.  I asked his prison guard to leave the room during my exam. He is alert and appears comfortable resting quietly in bed.  HENT:  Mouth/Throat: No oropharyngeal exudate.  Eyes: Conjunctivae are normal.  Neck: Neck supple.  Cardiovascular: Normal rate and regular rhythm.   No murmur heard. Pulmonary/Chest: Effort normal and breath sounds normal. He has no wheezes. He has no rales.  No rub heard.  Abdominal: Soft. There is no tenderness.  Musculoskeletal: Normal range of motion. He exhibits no edema or tenderness.  Neurological: He is alert and oriented to person, place, and time.  Skin: No rash noted.  Psychiatric: Mood and affect normal.    Lab Results Lab Results  Component Value Date   WBC 2.5 (L) 03/16/2017   HGB 8.9 (L) 03/16/2017   HCT 26.9 (L) 03/16/2017   MCV 78.0 03/16/2017   PLT 173 03/16/2017    Lab Results  Component Value Date   CREATININE 1.65 (H) 03/16/2017   BUN 23 (H) 03/16/2017   NA 135  03/16/2017   K 5.3 (H) 03/16/2017   CL 108 03/16/2017   CO2 21 (L) 03/16/2017    Lab Results  Component Value Date   ALT 11 (L) 03/16/2017   AST 17 03/16/2017   ALKPHOS 91 03/16/2017   BILITOT 0.2 (L) 03/16/2017     Microbiology: Recent Results (from the past 240 hour(s))  Culture, blood (x 2)     Status: None (Preliminary result)   Collection Time: 03/15/17  8:27 PM  Result Value Ref Range Status   Specimen Description BLOOD LEFT HAND  Final   Special Requests IN PEDIATRIC BOTTLE Blood Culture adequate volume  Final   Culture   Final    NO GROWTH < 24 HOURS Performed at [facility], [place] [address] [place], [place] [address]  Report Status PENDING  Incomplete  Culture, blood (x 2)     Status: None (Preliminary result)   Collection Time: 03/15/17  8:27 PM  Result Value Ref Range Status   Specimen Description BLOOD LEFT HAND  Final   Special Requests   Final    BOTTLES DRAWN AEROBIC ONLY Blood Culture adequate volume   Culture   Final    NO GROWTH < 24 HOURS Performed at [facility], [address], [place], [place] [address]    Report Status PENDING  Incomplete  MRSA PCR Screening     Status: None   Collection Time: 03/15/17  9:00 PM  Result Value Ref Range Status   MRSA by PCR NEGATIVE NEGATIVE Final    Comment:        The GeneXpert MRSA Assay (FDA approved for NASAL specimens only), is one component of a comprehensive MRSA colonization surveillance program. It is not intended to diagnose MRSA infection nor to guide or monitor treatment for MRSA infections.     Michel Bickers, MD [facility] for Infectious [facility] [phone] pager   [phone] cell 03/16/2017, 5:06 PM

## 2017-03-16 NOTE — Plan of Care (Signed)
Problem: Physical Regulation: Goal: Will remain free from infection Outcome: Progressing IV abx, ID consult

## 2017-03-17 DIAGNOSIS — I10 Essential (primary) hypertension: Secondary | ICD-10-CM

## 2017-03-17 DIAGNOSIS — E785 Hyperlipidemia, unspecified: Secondary | ICD-10-CM

## 2017-03-17 LAB — HIV 1/2 AB DIFFERENTIATION
HIV 1 Ab: POSITIVE — AB
HIV 2 AB: NEGATIVE

## 2017-03-17 LAB — CBC
HCT: 26.6 % — ABNORMAL LOW (ref 39.0–52.0)
Hemoglobin: 8.9 g/dL — ABNORMAL LOW (ref 13.0–17.0)
MCH: 25.9 pg — ABNORMAL LOW (ref 26.0–34.0)
MCHC: 33.5 g/dL (ref 30.0–36.0)
MCV: 77.3 fL — AB (ref 78.0–100.0)
PLATELETS: 161 10*3/uL (ref 150–400)
RBC: 3.44 MIL/uL — AB (ref 4.22–5.81)
RDW: 15.8 % — ABNORMAL HIGH (ref 11.5–15.5)
WBC: 3.2 10*3/uL — AB (ref 4.0–10.5)

## 2017-03-17 LAB — LEGIONELLA PNEUMOPHILA SEROGP 1 UR AG: L. PNEUMOPHILA SEROGP 1 UR AG: NEGATIVE

## 2017-03-17 LAB — BASIC METABOLIC PANEL
ANION GAP: 3 — AB (ref 5–15)
BUN: 23 mg/dL — ABNORMAL HIGH (ref 6–20)
CHLORIDE: 113 mmol/L — AB (ref 101–111)
CO2: 22 mmol/L (ref 22–32)
Calcium: 8.5 mg/dL — ABNORMAL LOW (ref 8.9–10.3)
Creatinine, Ser: 1.56 mg/dL — ABNORMAL HIGH (ref 0.61–1.24)
GFR calc non Af Amer: 50 mL/min — ABNORMAL LOW (ref >60)
GFR, EST AFRICAN AMERICAN: 58 mL/min — AB (ref >60)
Glucose, Bld: 115 mg/dL — ABNORMAL HIGH (ref 65–99)
Potassium: 5.1 mmol/L (ref 3.5–5.1)
Sodium: 138 mmol/L (ref 135–145)

## 2017-03-17 LAB — HIV ANTIBODY (ROUTINE TESTING W REFLEX): HIV Screen 4th Generation wRfx: REACTIVE — AB

## 2017-03-17 LAB — T-HELPER CELLS (CD4) COUNT (NOT AT ARMC)
CD4 % Helper T Cell: 15 % — ABNORMAL LOW (ref 33–55)
CD4 T CELL ABS: 100 /uL — AB (ref 400–2700)

## 2017-03-17 MED ORDER — AMOXICILLIN 250 MG PO CAPS
500.0000 mg | ORAL_CAPSULE | Freq: Three times a day (TID) | ORAL | Status: DC
  Administered 2017-03-17: 500 mg via ORAL
  Filled 2017-03-17: qty 2

## 2017-03-17 MED ORDER — POLYETHYLENE GLYCOL 3350 17 G PO PACK: 17.0000 g | PACK | Freq: Every day | ORAL | 0 refills | Status: DC | PRN

## 2017-03-17 MED ORDER — AMOXICILLIN 500 MG PO CAPS: 500.0000 mg | ORAL_CAPSULE | Freq: Three times a day (TID) | ORAL | 0 refills | Status: AC

## 2017-03-17 NOTE — Progress Notes (Signed)
Report called to Ocie Cornfield, RN at [facility]. All questions answered, contact number provided. Pt and prison officer at bedside aware of discharge.

## 2017-03-17 NOTE — Discharge Summary (Addendum)
Physician Discharge Summary  Joseph Quinn ZJQ:[id number] DOB: 1965-11-09 DOA: 03/15/2017  PCP: Patient, No Pcp Per  Admit date: 03/15/2017 Discharge date: 03/17/2017  Admitted From: Prison Disposition:  Prison  Recommendations for Outpatient Follow-up:  1. Follow up with PCP in 1-2 weeks  Discharge Condition:Stable CODE STATUS:Full Diet recommendation: Regular   Brief/Interim Summary: 51 y.o.malewith medical history significant of HIV/aids(last CD4 count noted to be 114), HTN, HLD, CAD s/p stent, chronic diastolic CHF; who presents with complaints of right-sided chest pain since last night at around 10 PM. Patient describes the pain as severe worsened with any inspiratory breath. Endorses having a cough for the last few days,but has not been able to cough anything up due to the pain. Associated symptoms include shortness of breath, nausea, nonbloody emesis 2 episodes, and diarrhea reporting more from 5-6, missed the last 3-4 days. He had been hospitalized and diagnosed at [facility] with strep pneumonia bacteremia secondary to right lower lobe pneumonia on 9/4,and had been treated with high-dose Rocephin with vancomycin for 8 days and transitioned to Levaquin at discharge. Blood cultures were noted to have cleared and sensitivities to cultures were noted to be adequate. He was also started on dapsone during that visit for PCP prophylaxis due to his low CD4 counts. A transthoracic echocardiogram was obtained showed EF of approximately 55%. However, after being discharged back to prison patient reports not receiving these medications. He subsequently developed recurrent fevers and was admitted back into the hospital on 9/11- 9/17. At that time it appears blood cultures were negative, but he was started on Levaquin and prednisone and her fevers resolved in the first 24 hours. He underwent repeat transthoracic echocardiogram because note of a newmurmur on physical exam  previously noted. as well as transesophageal echocardiogram which appeared to be similar to previous echocardiogram and no vegetation noted. Dr. Randall Hiss of [facility] ID was consulted and recommended changing antibiotics from Levaquin to Rocephin 2 g daily and continue dapsone. Patient completed 14 days of antibiotic therapy for bacteremia on 9/17, and was discharged back to the facility. Subsequently,patient was evaluated on 9/24 with CT angiogram at [facility] in [place] show no signs of a pulmonary embolus. Due to his recurrent symptoms of chest pain he was evaluated at [facility] where a CT angiogram was obtained and revealed a mildly loculated right pleural effusion noted posteriorly and 4 mm nodule in the left upper lobe. Labs revealed WBC 3.3, hemoglobin 10.8, hematocrit 33.0  Right-sidedchest pain 2/2 Suspected Empyema:Acute. Patient complains of right sided chest pain especially with taking a deep inspiratory breath. CT angiogram at outside facility seen and chart reveals mild loculated right pleural effusion posteriorly with possible atelectasis on the right lower lung lobe. Patient was given 1 dose of Zosyn prior to transport. - Patient was continued on rocephin IV - infectious disease was consulted. There are concerns that patient did not receive complete full course of antibiotics following his prior diagnosis of pneumonia. - infectious disease recommendations to complete 3 weeks of amoxicillin 500 mg by mouth 3 times a day to complete course.  Dyspnea and hypoxia: Acute. Patient reports having shortness of breath. initial ABG noted to be compensated with pH 7.36, the CO2 38, PO2 77.  - Continuous pulse oximetry with nasal Oxygen as needed. - Will continue with Duonebs prn SOB/Cough - patient was continued on when necessary Mucinex  HIV (human immunodeficiency virus infection): As noted CD4 count was 114, CD4 helper percentage 28.5 - Will continue with darunavir,  Tivicay, Norvir,  and Descovy - Infectious disease consulted,per above  Pancytopenia: Patient presented to outside facility today and noted to haveWBC 3.3, and hemoglobin 10.8. - remained stable  Essential hypertension - Will continue on amlodipine and coreg  Possible Acute kidney injury on chronic kidney disease:Patient's last creatinine seen on records was previously 1.6 on 9/16. He presents with a creatinine of 1.8 on admission with BUN 23. - Check renal ultrasound - IV fluids normal saline at 100 ml/hr -remains stable  Diastolic CHF: Last EF noted to be within normal limits 02/2017. - Continue with strict ins and outs and daily weights  Coronary artery disease:Patient s/p stent placement  - Continue Plavix and ASA  Hyperlipidemia - continue pravastatin  Tobacco abuse: Patient endorses still smoking cigarettes and declines nicotine patch. - Cessation was done  Stage 3 ckd  Discharge Diagnoses:  Principal Problem:   Empyema, right ([place]) Active Problems:   HIV (human immunodeficiency virus infection) ([place])   Right-sided chest pain   Acute kidney injury superimposed on chronic kidney disease ([place])   Dyslipidemia   Hypertension   Coronary artery disease   Normocytic anemia    Discharge Instructions   Allergies as of 03/17/2017      Reactions   Sulfa Antibiotics    Pt states it made him go into coma      Medication List    TAKE these medications   amLODipine 5 MG tablet Commonly known as:  NORVASC Take 5 mg by mouth daily.   amoxicillin 500 MG capsule Commonly known as:  AMOXIL Take 1 capsule (500 mg total) by mouth every 8 (eight) hours.   aspirin EC 81 MG tablet Take 81 mg by mouth daily.   camphor-menthol lotion Commonly known as:  SARNA Apply 1 application topically daily. Limit 1 bottle/month.   carvedilol 6.25 MG tablet Commonly known as:  COREG Take 6.25 mg by mouth 2 (two) times daily with a meal.   clopidogrel 75 MG tablet Commonly known as:   PLAVIX Take 75 mg by mouth daily.   dapsone 100 MG tablet Take 100 mg by mouth daily.   darunavir 800 MG tablet Commonly known as:  PREZISTA Take 800 mg by mouth daily. Take with Norvir and with a full meal.   dolutegravir 50 MG tablet Commonly known as:  TIVICAY Take 50 mg by mouth 2 (two) times daily.   emtricitabine-tenofovir AF 200-25 MG tablet Commonly known as:  DESCOVY Take 1 tablet by mouth daily. Take with Prezcobix.   ferrous sulfate 325 (65 FE) MG tablet Take 325 mg by mouth daily with breakfast.   HEPATITIS A-HEP B RECOMB VAC IM Inject 1 mL into the muscle See admin instructions. Series consists of  3 intramuscular (deltoid) injections.  First dose to be given on the elected start date; second dose no less than 4 weeks after the first dose; third dose no less than 5 months after the 2nd dose.   hydrocerin Crea Apply 1 application topically daily. Limit 120 grams/month.   hydrocortisone 2.5 % rectal cream Commonly known as:  ANUSOL-HC Place 1 application rectally 2 (two) times daily as needed for hemorrhoids or anal itching.   loperamide 2 MG capsule Commonly known as:  IMODIUM Take 2 mg by mouth 3 (three) times daily as needed for diarrhea or loose stools.   polycarbophil 625 MG tablet Commonly known as:  FIBERCON Take 1,250 mg by mouth 4 (four) times daily. Must drink at least 100 ounces of fluids daily  with this medication.   polyethylene glycol packet Commonly known as:  MIRALAX / GLYCOLAX Take 17 g by mouth daily as needed for moderate constipation (constipation.). What changed:  reasons to take this   pravastatin 40 MG tablet Commonly known as:  PRAVACHOL Take 40 mg by mouth at bedtime.   ritonavir 100 MG Tabs tablet Commonly known as:  NORVIR Take 100 mg by mouth daily. Take with Prezista and with a full meal daily.   saccharomyces boulardii 250 MG capsule Commonly known as:  FLORASTOR Take 250 mg by mouth 2 (two) times daily. For 14 days until  03/24/2018.            Discharge Care Instructions        Start     Ordered   03/17/17 0000  polyethylene glycol (MIRALAX / GLYCOLAX) packet  Daily PRN     03/17/17 1301   03/17/17 0000  amoxicillin (AMOXIL) 500 MG capsule  Every 8 hours     03/17/17 1302     Follow-up Information    Follow up with PCP in 1-2 weeks. Schedule an appointment as soon as possible for a visit in 1 week(s).          Allergies  Allergen Reactions  . Sulfa Antibiotics     Pt states it made him go into coma    Consultations:  Infectious disease  Procedures/Studies: US Renal  Result Date: 03/16/2017 CLINICAL DATA:  Acute kidney injury, smoker EXAM: RENAL / URINARY TRACT ULTRASOUND COMPLETE COMPARISON:  None FINDINGS: Right Kidney: Length: 11.4 cm. Normal cortical thickness. Increased cortical echogenicity. No mass, hydronephrosis, or shadowing calcification. Left Kidney: Length: 10.9 cm. Normal cortical thickness. Increased cortical echogenicity. No mass, hydronephrosis or shadowing calcification. Bladder: Appears normal for degree of bladder distention. IMPRESSION: Medical renal disease changes of both kidneys. No evidence of renal mass or hydronephrosis. Electronically Signed   By: Lavonia Dana M.D.   On: 03/16/2017 08:25    Subjective: Without complaints  Discharge Exam: Vitals:   03/17/17 0604 03/17/17 0946  BP: (!) 150/98 125/69  Pulse: 63 76  Resp: 18 16  Temp: 98.3 F (36.8 C)   SpO2: 99% 98%   Vitals:   03/16/17 2015 03/17/17 0551 03/17/17 0604 03/17/17 0946  BP: 136/75  (!) 150/98 125/69  Pulse: 73  63 76  Resp: 18  18 16   Temp: 98.7 F (37.1 C)  98.3 F (36.8 C)   TempSrc: Oral  Oral   SpO2: 97%  99% 98%  Weight:  76.2 kg (167 lb 15.9 oz)    Height:        General: Pt is alert, awake, not in acute distress Cardiovascular: RRR, S1/S2 +, no rubs, no gallops Respiratory: CTA bilaterally, no wheezing, no rhonchi Abdominal: Soft, NT, ND, bowel sounds + Extremities: no  edema, no cyanosis   The results of significant diagnostics from this hospitalization (including imaging, microbiology, ancillary and laboratory) are listed below for reference.     Microbiology: Recent Results (from the past 240 hour(s))  Culture, blood (x 2)     Status: None (Preliminary result)   Collection Time: 03/15/17  8:27 PM  Result Value Ref Range Status   Specimen Description BLOOD LEFT HAND  Final   Special Requests IN PEDIATRIC BOTTLE Blood Culture adequate volume  Final   Culture   Final    NO GROWTH 2 DAYS Performed at [facility], [address], [place], [place] [address]    Report  Status PENDING  Incomplete  Culture, blood (x 2)     Status: None (Preliminary result)   Collection Time: 03/15/17  8:27 PM  Result Value Ref Range Status   Specimen Description BLOOD LEFT HAND  Final   Special Requests   Final    BOTTLES DRAWN AEROBIC ONLY Blood Culture adequate volume   Culture   Final    NO GROWTH 2 DAYS Performed at [facility], [address] [place], [place] [address]    Report Status PENDING  Incomplete  MRSA PCR Screening     Status: None   Collection Time: 03/15/17  9:00 PM  Result Value Ref Range Status   MRSA by PCR NEGATIVE NEGATIVE Final    Comment:        The GeneXpert MRSA Assay (FDA approved for NASAL specimens only), is one component of a comprehensive MRSA colonization surveillance program. It is not intended to diagnose MRSA infection nor to guide or monitor treatment for MRSA infections.      Labs: BNP (last 3 results) No results for input(s): BNP in the last 8760 hours. Basic Metabolic Panel:  Recent Labs Lab 03/16/17 0518 03/17/17 0455  NA 135 138  K 5.3* 5.1  CL 108 113*  CO2 21* 22  GLUCOSE 166* 115*  BUN 23* 23*  CREATININE 1.65* 1.56*  CALCIUM 8.5* 8.5*   Liver Function Tests:  Recent Labs Lab 03/16/17 0518  AST 17  ALT 11*  ALKPHOS 91  BILITOT 0.2*  PROT 6.8  ALBUMIN 2.4*   No results for  input(s): LIPASE, AMYLASE in the last 168 hours. No results for input(s): AMMONIA in the last 168 hours. CBC:  Recent Labs Lab 03/16/17 0518 03/17/17 0455  WBC 2.5* 3.2*  HGB 8.9* 8.9*  HCT 26.9* 26.6*  MCV 78.0 77.3*  PLT 173 161   Cardiac Enzymes:  Recent Labs Lab 03/15/17 2132  TROPONINI <0.03   BNP: Invalid input(s): POCBNP CBG: No results for input(s): GLUCAP in the last 168 hours. D-Dimer No results for input(s): DDIMER in the last 72 hours. Hgb A1c No results for input(s): HGBA1C in the last 72 hours. Lipid Profile No results for input(s): CHOL, HDL, LDLCALC, TRIG, CHOLHDL, LDLDIRECT in the last 72 hours. Thyroid function studies No results for input(s): TSH, T4TOTAL, T3FREE, THYROIDAB in the last 72 hours.  Invalid input(s): FREET3 Anemia work up No results for input(s): VITAMINB12, FOLATE, FERRITIN, TIBC, IRON, RETICCTPCT in the last 72 hours. Urinalysis No results found for: COLORURINE, APPEARANCEUR, [place], [place], [place], [place], [place], [place], PROTEINUR, UROBILINOGEN, NITRITE, LEUKOCYTESUR Sepsis Labs Invalid input(s): PROCALCITONIN,  WBC,  LACTICIDVEN Microbiology Recent Results (from the past 240 hour(s))  Culture, blood (x 2)     Status: None (Preliminary result)   Collection Time: 03/15/17  8:27 PM  Result Value Ref Range Status   Specimen Description BLOOD LEFT HAND  Final   Special Requests IN PEDIATRIC BOTTLE Blood Culture adequate volume  Final   Culture   Final    NO GROWTH 2 DAYS Performed at [facility], [address], [place], [place] [address]    Report Status PENDING  Incomplete  Culture, blood (x 2)     Status: None (Preliminary result)   Collection Time: 03/15/17  8:27 PM  Result Value Ref Range Status   Specimen Description BLOOD LEFT HAND  Final   Special Requests   Final    BOTTLES DRAWN AEROBIC ONLY Blood Culture adequate volume   Culture  Final    NO GROWTH 2 DAYS Performed at [facility],  [place] [address] [place], [place] [address]    Report Status PENDING  Incomplete  MRSA PCR Screening     Status: None   Collection Time: 03/15/17  9:00 PM  Result Value Ref Range Status   MRSA by PCR NEGATIVE NEGATIVE Final    Comment:        The GeneXpert MRSA Assay (FDA approved for NASAL specimens only), is one component of a comprehensive MRSA colonization surveillance program. It is not intended to diagnose MRSA infection nor to guide or monitor treatment for MRSA infections.      SIGNED:   Donne Hazel, MD  [facility] 03/17/2017, 1:10 PM  If 7PM-7AM, please contact night-coverage [email or web address] Password [email or web address]

## 2017-03-17 NOTE — Care Management Note (Signed)
Case Management Note  Patient Details  Name: Joseph Quinn MRN: [id number] Date of Birth: 01/20/1966  Subjective/Objective:  No CM needs.                  Action/Plan:d/c back to Prison by officers.   Expected Discharge Date:   (unknown)               Expected Discharge Plan:  Corrections Facility  In-House Referral:     Discharge planning Services  CM Consult  Post Acute Care Choice:    Choice offered to:     DME Arranged:    DME Agency:     HH Arranged:    [place] Agency:     Status of Service:  Completed, signed off  If discussed at [employer] of Stay Meetings, dates discussed:    Additional Comments:  Dessa Phi, RN 03/17/2017, 12:00 PM

## 2017-03-17 NOTE — Progress Notes (Addendum)
Patient ID: Joseph Quinn, male   DOB: 08-09-1965, 51 y.o.   MRN: [id number]          [facility] for Infectious Disease  Date of Admission:  03/15/2017           Day 2 ceftriaxone ASSESSMENT: I suspect that he has a persistent right pleural empyema complicating his recent bacteremic pneumococcal pneumonia. His best I can tell he received 2 interrupted courses of IV ceftriaxone totaling about 8 days in length. His most recent blood cultures here are negative. I called [facility] in [facility] and learned that his pneumococcal isolate was sensitive to penicillin. I recommend 3 weeks of oral amoxicillin to complete his treatment.  His CD4 count and HIV viral load are pending. I would continue his current salvage antiretroviral regimen.  PLAN: 1. Change IV ceftriaxone to oral amoxicillin 500 mg 3 times daily for 3 weeks 2. Continue current antiretroviral regimen 3. I will sign off now  Principal Problem:   Empyema, right ([place]) Active Problems:   HIV (human immunodeficiency virus infection) ([place])   Right-sided chest pain   Acute kidney injury superimposed on chronic kidney disease ([place])   Dyslipidemia   Hypertension   Coronary artery disease   Normocytic anemia   . amLODipine  5 mg Oral Daily  . aspirin EC  81 mg Oral Daily  . camphor-menthol  1 application Topical Daily  . carvedilol  6.25 mg Oral BID WC  . clopidogrel  75 mg Oral Daily  . dapsone  100 mg Oral Daily  . darunavir  800 mg Oral Q breakfast  . dolutegravir  50 mg Oral BID  . emtricitabine-tenofovir AF  1 tablet Oral Daily  . enoxaparin (LOVENOX) injection  40 mg Subcutaneous Q24H  . ferrous sulfate  325 mg Oral Q breakfast  . guaiFENesin  600 mg Oral BID  . hydrocerin  1 application Topical Daily  . pravastatin  40 mg Oral QHS  . ritonavir  100 mg Oral Q breakfast  . saccharomyces boulardii  250 mg Oral BID    SUBJECTIVE: He is still having some right-sided chest pain  but it is better today. He has not required any pain medication so far today.  Review of Systems: Review of Systems  Constitutional: Negative for chills, diaphoresis, fever and weight loss.  Respiratory: Positive for cough. Negative for sputum production and shortness of breath.   Cardiovascular: Positive for chest pain.  Gastrointestinal: Negative for abdominal pain, diarrhea, nausea and vomiting.    Allergies  Allergen Reactions  . Sulfa Antibiotics     Pt states it made him go into coma    OBJECTIVE: Vitals:   03/16/17 2015 03/17/17 0551 03/17/17 0604 03/17/17 0946  BP: 136/75  (!) 150/98 125/69  Pulse: 73  63 76  Resp: 18  18 16   Temp: 98.7 F (37.1 C)  98.3 F (36.8 C)   TempSrc: Oral  Oral   SpO2: 97%  99% 98%  Weight:  167 lb 15.9 oz (76.2 kg)    Height:       Body mass index is 25.54 kg/m.  Physical Exam  Constitutional: He is oriented to person, place, and time.  He is resting quietly in bed.  Cardiovascular: Normal rate and regular rhythm.  Exam reveals no gallop.   No murmur heard. Pulmonary/Chest: Effort normal and breath sounds normal. He has no wheezes. He has no rales.  No rubs.  Neurological: He is alert and oriented to  person, place, and time.  Skin: No rash noted.  Psychiatric: Mood and affect normal.    Lab Results Lab Results  Component Value Date   WBC 3.2 (L) 03/17/2017   HGB 8.9 (L) 03/17/2017   HCT 26.6 (L) 03/17/2017   MCV 77.3 (L) 03/17/2017   PLT 161 03/17/2017    Lab Results  Component Value Date   CREATININE 1.56 (H) 03/17/2017   BUN 23 (H) 03/17/2017   NA 138 03/17/2017   K 5.1 03/17/2017   CL 113 (H) 03/17/2017   CO2 22 03/17/2017    Lab Results  Component Value Date   ALT 11 (L) 03/16/2017   AST 17 03/16/2017   ALKPHOS 91 03/16/2017   BILITOT 0.2 (L) 03/16/2017     Microbiology: Recent Results (from the past 240 hour(s))  Culture, blood (x 2)     Status: None (Preliminary result)   Collection Time: 03/15/17   8:27 PM  Result Value Ref Range Status   Specimen Description BLOOD LEFT HAND  Final   Special Requests IN PEDIATRIC BOTTLE Blood Culture adequate volume  Final   Culture   Final    NO GROWTH 2 DAYS Performed at [facility], [place] [address] [place], [place] [address]    Report Status PENDING  Incomplete  Culture, blood (x 2)     Status: None (Preliminary result)   Collection Time: 03/15/17  8:27 PM  Result Value Ref Range Status   Specimen Description BLOOD LEFT HAND  Final   Special Requests   Final    BOTTLES DRAWN AEROBIC ONLY Blood Culture adequate volume   Culture   Final    NO GROWTH 2 DAYS Performed at [facility], [place] [address], [place], [place] [address]    Report Status PENDING  Incomplete  MRSA PCR Screening     Status: None   Collection Time: 03/15/17  9:00 PM  Result Value Ref Range Status   MRSA by PCR NEGATIVE NEGATIVE Final    Comment:        The GeneXpert MRSA Assay (FDA approved for NASAL specimens only), is one component of a comprehensive MRSA colonization surveillance program. It is not intended to diagnose MRSA infection nor to guide or monitor treatment for MRSA infections.     Michel Bickers, MD [facility] for Infectious [place] [phone] pager   [phone] cell 03/17/2017, 12:26 PM

## 2017-03-20 LAB — HIV-1 RNA ULTRAQUANT REFLEX TO GENTYP+
HIV-1 RNA BY PCR: 1540 copies/mL
HIV-1 RNA Quant, Log: 3.188 log10copy/mL

## 2017-03-20 LAB — CULTURE, BLOOD (ROUTINE X 2)
CULTURE: NO GROWTH
CULTURE: NO GROWTH
Special Requests: ADEQUATE
Special Requests: ADEQUATE

## 2017-03-20 LAB — REFLEX TO GENOSURE(R) MG
HIV GENOSURE(R): UNDETERMINED
HIV GenoSure(R) MG PDF: UNDETERMINED
HIV GenoSure: UNDETERMINED

## 2017-12-04 DIAGNOSIS — R079 Chest pain, unspecified: Secondary | ICD-10-CM

## 2017-12-04 DIAGNOSIS — N184 Chronic kidney disease, stage 4 (severe): Secondary | ICD-10-CM

## 2018-04-14 DIAGNOSIS — Z720 Tobacco use: Secondary | ICD-10-CM

## 2018-04-14 DIAGNOSIS — N183 Chronic kidney disease, stage 3 (moderate): Secondary | ICD-10-CM

## 2018-04-14 DIAGNOSIS — R079 Chest pain, unspecified: Secondary | ICD-10-CM | POA: Diagnosis not present

## 2018-04-14 DIAGNOSIS — I10 Essential (primary) hypertension: Secondary | ICD-10-CM | POA: Diagnosis not present

## 2018-04-15 DIAGNOSIS — R079 Chest pain, unspecified: Secondary | ICD-10-CM | POA: Diagnosis not present

## 2018-04-15 DIAGNOSIS — Z720 Tobacco use: Secondary | ICD-10-CM | POA: Diagnosis not present

## 2018-04-15 DIAGNOSIS — I2510 Atherosclerotic heart disease of native coronary artery without angina pectoris: Secondary | ICD-10-CM

## 2018-04-15 DIAGNOSIS — I10 Essential (primary) hypertension: Secondary | ICD-10-CM | POA: Diagnosis not present

## 2018-04-15 DIAGNOSIS — N183 Chronic kidney disease, stage 3 (moderate): Secondary | ICD-10-CM | POA: Diagnosis not present

## 2018-04-16 DIAGNOSIS — R079 Chest pain, unspecified: Secondary | ICD-10-CM | POA: Diagnosis not present

## 2018-04-16 DIAGNOSIS — N183 Chronic kidney disease, stage 3 (moderate): Secondary | ICD-10-CM | POA: Diagnosis not present

## 2018-04-16 DIAGNOSIS — I10 Essential (primary) hypertension: Secondary | ICD-10-CM | POA: Diagnosis not present

## 2018-04-16 DIAGNOSIS — Z720 Tobacco use: Secondary | ICD-10-CM | POA: Diagnosis not present

## 2018-04-17 ENCOUNTER — Inpatient Hospital Stay (HOSPITAL_COMMUNITY)
Admission: AD | Admit: 2018-04-17 | Discharge: 2018-04-18 | DRG: 292 | Disposition: A | Discharge status: Home / Self Care | Source: Other Acute Inpatient Hospital | Attending: Internal Medicine | Admitting: Internal Medicine

## 2018-04-17 ENCOUNTER — Encounter (HOSPITAL_COMMUNITY): Payer: Self-pay | Admitting: Physician Assistant

## 2018-04-17 DIAGNOSIS — N183 Chronic kidney disease, stage 3 unspecified: Secondary | ICD-10-CM | POA: Diagnosis present

## 2018-04-17 DIAGNOSIS — E785 Hyperlipidemia, unspecified: Secondary | ICD-10-CM | POA: Diagnosis present

## 2018-04-17 DIAGNOSIS — R079 Chest pain, unspecified: Secondary | ICD-10-CM | POA: Diagnosis not present

## 2018-04-17 DIAGNOSIS — Z79899 Other long term (current) drug therapy: Secondary | ICD-10-CM

## 2018-04-17 DIAGNOSIS — N184 Chronic kidney disease, stage 4 (severe): Secondary | ICD-10-CM | POA: Diagnosis present

## 2018-04-17 DIAGNOSIS — I2510 Atherosclerotic heart disease of native coronary artery without angina pectoris: Secondary | ICD-10-CM | POA: Diagnosis present

## 2018-04-17 DIAGNOSIS — R0789 Other chest pain: Secondary | ICD-10-CM | POA: Diagnosis present

## 2018-04-17 DIAGNOSIS — Z765 Malingerer [conscious simulation]: Secondary | ICD-10-CM | POA: Diagnosis not present

## 2018-04-17 DIAGNOSIS — Z21 Asymptomatic human immunodeficiency virus [HIV] infection status: Secondary | ICD-10-CM | POA: Diagnosis not present

## 2018-04-17 DIAGNOSIS — Z720 Tobacco use: Secondary | ICD-10-CM | POA: Diagnosis not present

## 2018-04-17 DIAGNOSIS — I25119 Atherosclerotic heart disease of native coronary artery with unspecified angina pectoris: Secondary | ICD-10-CM

## 2018-04-17 DIAGNOSIS — I10 Essential (primary) hypertension: Secondary | ICD-10-CM | POA: Diagnosis present

## 2018-04-17 DIAGNOSIS — B20 Human immunodeficiency virus [HIV] disease: Secondary | ICD-10-CM | POA: Diagnosis present

## 2018-04-17 DIAGNOSIS — I130 Hypertensive heart and chronic kidney disease with heart failure and stage 1 through stage 4 chronic kidney disease, or unspecified chronic kidney disease: Principal | ICD-10-CM | POA: Diagnosis present

## 2018-04-17 DIAGNOSIS — I5032 Chronic diastolic (congestive) heart failure: Secondary | ICD-10-CM | POA: Diagnosis present

## 2018-04-17 HISTORY — DX: Unspecified chronic bronchitis: J42

## 2018-04-17 HISTORY — DX: Atherosclerotic heart disease of native coronary artery without angina pectoris: I25.10

## 2018-04-17 HISTORY — DX: Obstructive sleep apnea (adult) (pediatric): G47.33

## 2018-04-17 HISTORY — DX: Hyperlipidemia, unspecified: E78.5

## 2018-04-17 HISTORY — DX: Chronic diastolic (congestive) heart failure: I50.32

## 2018-04-17 HISTORY — DX: Essential (primary) hypertension: I10

## 2018-04-17 HISTORY — DX: Dependence on other enabling machines and devices: Z99.89

## 2018-04-17 HISTORY — DX: Chronic kidney disease, stage 4 (severe): N18.4

## 2018-04-17 LAB — MRSA PCR SCREENING: MRSA by PCR: NEGATIVE

## 2018-04-17 MED ORDER — ONDANSETRON HCL 4 MG/2ML IJ SOLN
4.0000 mg | Freq: Four times a day (QID) | INTRAMUSCULAR | Status: DC | PRN
  Administered 2018-04-18: 4 mg via INTRAVENOUS
  Filled 2018-04-17: qty 2

## 2018-04-17 MED ORDER — HEPARIN (PORCINE) IN NACL 100-0.45 UNIT/ML-% IJ SOLN
1100.0000 [IU]/h | INTRAMUSCULAR | Status: DC
  Administered 2018-04-17: 1100 [IU]/h via INTRAVENOUS
  Filled 2018-04-17: qty 250

## 2018-04-17 MED ORDER — ATORVASTATIN CALCIUM 20 MG PO TABS: 20.0000 mg | ORAL_TABLET | Freq: Every day | ORAL | Status: DC

## 2018-04-17 MED ORDER — MORPHINE SULFATE (PF) 2 MG/ML IV SOLN: 2.0000 mg | INTRAVENOUS | Status: DC | PRN

## 2018-04-17 MED ORDER — CAMPHOR-MENTHOL 0.5-0.5 % EX LOTN: 1.0000 "application " | TOPICAL_LOTION | Freq: Every day | CUTANEOUS | Status: DC | PRN

## 2018-04-17 MED ORDER — CLOPIDOGREL BISULFATE 75 MG PO TABS
75.0000 mg | ORAL_TABLET | Freq: Every day | ORAL | Status: DC
  Administered 2018-04-17 – 2018-04-18 (×2): 75 mg via ORAL
  Filled 2018-04-17 (×2): qty 1

## 2018-04-17 MED ORDER — ASPIRIN EC 81 MG PO TBEC
81.0000 mg | DELAYED_RELEASE_TABLET | Freq: Every day | ORAL | Status: DC
  Administered 2018-04-17 – 2018-04-18 (×2): 81 mg via ORAL
  Filled 2018-04-17 (×2): qty 1

## 2018-04-17 MED ORDER — ALUM & MAG HYDROXIDE-SIMETH 200-200-20 MG/5ML PO SUSP
30.0000 mL | ORAL | Status: DC | PRN
  Administered 2018-04-17: 30 mL via ORAL
  Filled 2018-04-17: qty 30

## 2018-04-17 MED ORDER — HYDROCERIN EX CREA: 1.0000 "application " | TOPICAL_CREAM | Freq: Every day | CUTANEOUS | Status: DC | PRN

## 2018-04-17 MED ORDER — SODIUM CHLORIDE 0.9 % IV SOLN
INTRAVENOUS | Status: DC
  Administered 2018-04-17 – 2018-04-18 (×2): via INTRAVENOUS

## 2018-04-17 MED ORDER — TRAMADOL HCL 50 MG PO TABS
100.0000 mg | ORAL_TABLET | Freq: Four times a day (QID) | ORAL | Status: DC | PRN
  Administered 2018-04-17: 100 mg via ORAL
  Filled 2018-04-17: qty 2

## 2018-04-17 MED ORDER — ISOSORBIDE MONONITRATE ER 30 MG PO TB24
30.0000 mg | ORAL_TABLET | Freq: Every day | ORAL | Status: DC
  Administered 2018-04-18: 30 mg via ORAL
  Filled 2018-04-17: qty 1

## 2018-04-17 MED ORDER — HYDRALAZINE HCL 10 MG PO TABS
10.0000 mg | ORAL_TABLET | Freq: Four times a day (QID) | ORAL | Status: DC
  Administered 2018-04-18 (×2): 10 mg via ORAL
  Filled 2018-04-17 (×3): qty 1

## 2018-04-17 MED ORDER — CARVEDILOL 6.25 MG PO TABS
6.2500 mg | ORAL_TABLET | Freq: Two times a day (BID) | ORAL | Status: DC
  Administered 2018-04-18: 6.25 mg via ORAL
  Filled 2018-04-17: qty 1

## 2018-04-17 MED ORDER — AMLODIPINE BESYLATE 10 MG PO TABS
10.0000 mg | ORAL_TABLET | Freq: Every day | ORAL | Status: DC
  Administered 2018-04-18: 10 mg via ORAL
  Filled 2018-04-17: qty 1

## 2018-04-17 MED ORDER — DARUNAVIR ETHANOLATE 800 MG PO TABS
800.0000 mg | ORAL_TABLET | Freq: Every day | ORAL | Status: DC
  Filled 2018-04-17 (×2): qty 1

## 2018-04-17 MED ORDER — RITONAVIR 100 MG PO TABS
100.0000 mg | ORAL_TABLET | Freq: Every day | ORAL | Status: DC
  Filled 2018-04-17 (×2): qty 1

## 2018-04-17 MED ORDER — DOLUTEGRAVIR SODIUM 50 MG PO TABS
50.0000 mg | ORAL_TABLET | Freq: Two times a day (BID) | ORAL | Status: DC
  Administered 2018-04-17 – 2018-04-18 (×2): 50 mg via ORAL
  Filled 2018-04-17 (×3): qty 1

## 2018-04-17 MED ORDER — NORTRIPTYLINE HCL 25 MG PO CAPS
25.0000 mg | ORAL_CAPSULE | Freq: Every day | ORAL | Status: DC
  Administered 2018-04-17: 25 mg via ORAL
  Filled 2018-04-17 (×2): qty 1

## 2018-04-17 MED ORDER — ACETAMINOPHEN 325 MG PO TABS: 650.0000 mg | ORAL_TABLET | ORAL | Status: DC | PRN

## 2018-04-17 MED ORDER — EMTRICITABINE-TENOFOVIR AF 200-25 MG PO TABS
1.0000 | ORAL_TABLET | Freq: Every day | ORAL | Status: DC
  Administered 2018-04-18: 1 via ORAL
  Filled 2018-04-17 (×2): qty 1

## 2018-04-17 NOTE — Consult Note (Signed)
Cardiology Consultation:   Patient ID: Joseph Quinn MRN: [id number]; DOB: [birth year]  Admit date: 04/17/2018 Date of Consult: 04/17/2018  Primary Care Provider: Patient, No Pcp Per Primary Cardiologist: Unknown cardiologist with [facility] in [place] Primary Electrophysiologist:  None    Patient Profile:   Joseph Quinn is a 52 y.o. male with a hx of CAD, HIV/AIDS, HTN, HLD, chronic diastolic heart failure, and CKD stage IV who is being seen today for the evaluation of chest pain transferred from [facility] at the request of Dr. Alcario Drought.  History of Present Illness:   Joseph Quinn is a 52 year old African-American male was past medical history of CAD, HIV/AIDS, HTN, HLD, chronic diastolic heart failure, and CKD stage IV.  Patient reportedly had a cardiac catheterization and a stent placement in unknown blood vessel in [place] 2014.  He also had a PCI to LAD in [facility] 2018.  He had a stress test obtained on 12/04/2017 which showed EF 54%, no ischemia or infarction.  Patient is currently incarcerated.  According to the patient, Saturday around 2 PM he started having substernal chest pressure radiating to the left side.  The intensity of the pain is similar to what he experienced during the previous angina in 2018.  However he says the location of the pain is slightly different.  In 2018, the chest discomfort involved in the left shoulder and also the left neck.  The chest pain lasted all the way from Saturday until Monday night.  He sought medical attention at [facility] and was admitted there.  Despite prolonged chest pain at rest, his troponin is borderline high and he remained flat.  On arrival his creatinine was 2.5, even with hydration, his creatinine improved to 2.2.  Since he had a normal stress test earlier this year, physician at [facility] discussed with the patient regarding cardiac catheterization and possibility of worsening renal function  associated with contrast nephropathy.  However patient wished to seek a definitive evaluation, therefore he was transferred from [facility] to [facility].  According to patient, he still have chest pain even right now.  He says the chest pain went away briefly Monday night however recurred since.  Cardiology has been consulted for evaluation of his chest discomfort.  Past Medical History:  Diagnosis Date  . CAD (coronary artery disease)   . Chronic diastolic heart failure ([place])   . CKD (chronic kidney disease), stage IV ([place])   . HIV (human immunodeficiency virus infection) ([place])   . Hyperlipidemia   . Hypertension   . Myocardial infarction ([place])    hx of 2 mi's per pt    Past Surgical History:  Procedure Laterality Date  . NO PAST SURGERIES       Home Medications:  Prior to Admission medications   Medication Sig Start Date End Date Taking? Authorizing Provider  amLODipine (NORVASC) 5 MG tablet Take 5 mg by mouth daily.    [provider]  aspirin EC 81 MG tablet Take 81 mg by mouth daily.    [provider]  camphor-menthol Timoteo Ace) lotion Apply 1 application topically daily. Limit 1 bottle/month.    [provider]  carvedilol (COREG) 6.25 MG tablet Take 6.25 mg by mouth 2 (two) times daily with a meal.    [provider]  clopidogrel (PLAVIX) 75 MG tablet Take 75 mg by mouth daily.    [provider]  darunavir (PREZISTA) 800 MG tablet Take 800 mg by mouth daily. Take with Norvir  and with a full meal.    [provider]  dolutegravir (TIVICAY) 50 MG tablet Take 50 mg by mouth 2 (two) times daily.    [provider]  emtricitabine-tenofovir AF (DESCOVY) 200-25 MG tablet Take 1 tablet by mouth daily. Take with Prezcobix.    [provider]  HEPATITIS A-HEP B RECOMB VAC IM Inject 1 mL into the muscle See admin instructions. Series consists of  3 intramuscular (deltoid) injections.  First dose to be  given on the elected start date; second dose no less than 4 weeks after the first dose; third dose no less than 5 months after the 2nd dose.    [provider]  hydrocerin (EUCERIN) CREA Apply 1 application topically daily. Limit 120 grams/month.    [provider]  ritonavir (NORVIR) 100 MG TABS tablet Take 100 mg by mouth daily. Take with Prezista and with a full meal daily.    [provider]    Inpatient Medications: Scheduled Meds: . [START ON 04/18/2018] amLODipine  10 mg Oral Daily  . aspirin EC  81 mg Oral Daily  . [START ON 04/18/2018] atorvastatin  20 mg Oral q1800  . [START ON 04/18/2018] carvedilol  6.25 mg Oral BID WC  . clopidogrel  75 mg Oral Daily  . [START ON 04/18/2018] darunavir  800 mg Oral Q breakfast  . dolutegravir  50 mg Oral BID  . [START ON 04/18/2018] emtricitabine-tenofovir AF  1 tablet Oral Daily  . [START ON 04/18/2018] hydrALAZINE  10 mg Oral Q6H  . [START ON 04/18/2018] isosorbide mononitrate  30 mg Oral Daily  . nortriptyline  25 mg Oral QHS  . [START ON 04/18/2018] ritonavir  100 mg Oral Daily   Continuous Infusions:  PRN Meds: acetaminophen, camphor-menthol, hydrocerin, ondansetron (ZOFRAN) IV, traMADol  Allergies:    Allergies  Allergen Reactions  . Sulfa Antibiotics     Pt states it made him go into coma    Social History:   Social History   Socioeconomic History  . Marital status: Single    Spouse name: Not on file  . Number of children: Not on file  . Years of education: Not on file  . Highest education level: Not on file  Occupational History  . Not on file  Social Needs  . Financial resource strain: Not on file  . Food insecurity:    Worry: Not on file    Inability: Not on file  . Transportation needs:    Medical: Not on file    Non-medical: Not on file  Tobacco Use  . Smoking status: Current Every Day Smoker    Packs/day: 0.50    Years: 20.00    Pack years: 10.00    Types: Cigarettes  .  Smokeless tobacco: Never Used  Substance and Sexual Activity  . Alcohol use: Yes    Comment: case per week  . Drug use: No  . Sexual activity: Not on file  Lifestyle  . Physical activity:    Days per week: Not on file    Minutes per session: Not on file  . Stress: Not on file  Relationships  . Social connections:    Talks on phone: Not on file    Gets together: Not on file    Attends religious service: Not on file    Active member of club or organization: Not on file    Attends meetings of clubs or organizations: Not on file    Relationship status: Not on  file  . Intimate partner violence:    Fear of current or ex partner: Not on file    Emotionally abused: Not on file    Physically abused: Not on file    Forced sexual activity: Not on file  Other Topics Concern  . Not on file  Social History Narrative  . Not on file    Family History:    Family History  Problem Relation Age of Onset  . Heart attack Father        past away around 74, h/o MI after age 89     ROS:  Please see the history of present illness.   All other ROS reviewed and negative.     Physical Exam/Data:   Vitals:   04/17/18 1855  BP: 136/86  Pulse: 83  Resp: 16  Temp: 99.8 F (37.7 C)  TempSrc: Oral  SpO2: 98%   No intake or output data in the 24 hours ending 04/17/18 2022 There were no vitals filed for this visit. There is no height or weight on file to calculate BMI.  General:  Well nourished, well developed, in no acute distress HEENT: normal Lymph: no adenopathy Neck: no JVD Endocrine:  No thryomegaly Vascular: No carotid bruits; FA pulses 2+ bilaterally without bruits  Cardiac:  normal S1, S2; RRR; no murmur  Lungs:  clear to auscultation bilaterally, no wheezing, rhonchi or rales  Abd: soft, nontender, no hepatomegaly  Ext: no edema Musculoskeletal:  No deformities, BUE and BLE strength normal and equal Skin: warm and dry  Neuro:  CNs 2-12 intact, no focal abnormalities  noted Psych:  Normal affect   EKG:  The EKG was personally reviewed and demonstrates: Outside EKG has been reviewed, sinus rhythm without any ST T wave changes.  Telemetry:  Telemetry was personally reviewed and demonstrates:  Starting on telemetry  Relevant CV Studies:  Myoview in 11/2017  Laboratory Data:  ChemistryNo results for input(s): NA, K, CL, CO2, GLUCOSE, BUN, CREATININE, CALCIUM, GFRNONAA, GFRAA, ANIONGAP in the last 168 hours.  No results for input(s): PROT, ALBUMIN, AST, ALT, ALKPHOS, BILITOT in the last 168 hours. HematologyNo results for input(s): WBC, RBC, HGB, HCT, MCV, MCH, MCHC, RDW, PLT in the last 168 hours. Cardiac EnzymesNo results for input(s): TROPONINI in the last 168 hours. No results for input(s): TROPIPOC in the last 168 hours.  BNPNo results for input(s): BNP, PROBNP in the last 168 hours.  DDimer No results for input(s): DDIMER in the last 168 hours.  Radiology/Studies:  No results found.  Assessment and Plan:   1. Chest pain: Somewhat atypical features including prolonged chest pain with flat borderline high troponin.  The characteristic with the chest pain is in a different location than the previous angina.  EKG without any ischemic changes.  Patient was transferred to [facility] for consideration of cardiac catheterization, however it was noted he had significant renal decline with creatinine trending from 1.5 in 2018 up to 2.5 on recent hospital admission.  Will continue on IV heparin, hydrate the patient overnight, reassess in a.m.  At this point, he is at high risk for contrast nephropathy.  We will obtain echocardiogram to assess ejection fraction and wall motion abnormality  2. CAD: Reportedly had PCI in 2014 and the 2018  3. Chronic diastolic heart failure: Patient is euvolemic on physical exam  4. Hypertension  5. Hyperlipidemia: FLP tomorrow. Continue lipitor.   6. history of HIV/AIDS      For questions or updates, please  contact [place] Please consult [email or web address] for contact info under     Signed, Almyra Deforest, [place]  04/17/2018 8:22 PM

## 2018-04-17 NOTE — Progress Notes (Signed)
Pt arrived from [place] obtained. CHG bath completed. Telemetry box applied and CCMD notified x2. Admissions MD notified of pt's arrival. No orders currently. MRSA swab completed, per protocol.   Ara Kussmaul BSN, RN

## 2018-04-17 NOTE — H&P (Signed)
History and Physical    Joseph Quinn CLE:[id number] DOB: May 27, 1966 DOA: 04/17/2018  PCP: Patient, No Pcp Per  Patient coming from: [place]  I have personally briefly reviewed patient's old medical records in [place]  Chief Complaint: Chest pain  HPI: Joseph Quinn is a 52 y.o. male with medical history significant of HIV, HTN, CKD, CAD s/p stents in 2014 and 2018, unknown artery and stent type in 2014.  DES to LAD in 2018.  Patient presented to [facility] from [facility] with c/o L sided chest pain, radiating to L shoulder, similar to CP with MI back in 2018.  CP was constant from Sat afternoon (when he went in to and was admitted to [facility], all the way to [facility] evening).  Neg stress test in 11/23/17.  EF 54%  Trops 0.09, 0.10, 0.09.  Due to neg stress test 4 months ago, persistent CP, and stable but slightly positive trops; cardiology consulted, recd transfer to [facility] for possible heart cath.   Review of Systems: As per HPI otherwise 10 point review of systems negative.   Past Medical History:  Diagnosis Date  . HIV (human immunodeficiency virus infection) ([place])   . Myocardial infarction ([place])    hx of 2 mi's per pt    Past Surgical History:  Procedure Laterality Date  . NO PAST SURGERIES       reports that he has been smoking cigarettes. He has a 10.00 pack-year smoking history. He has never used smokeless tobacco. He reports that he drinks alcohol. He reports that he does not use drugs.  Allergies  Allergen Reactions  . Sulfa Antibiotics     Pt states it made him go into coma    No family history on file.   Prior to Admission medications   Medication Sig Start Date End Date Taking? Authorizing Provider  amLODipine (NORVASC) 5 MG tablet Take 5 mg by mouth daily.    [provider]  aspirin EC 81 MG tablet Take 81 mg by mouth daily.    [provider]  camphor-menthol Timoteo Ace) lotion Apply 1 application topically daily. Limit 1 bottle/month.    [provider]  carvedilol (COREG) 6.25 MG tablet Take 6.25 mg by mouth 2 (two) times daily with a meal.    [provider]  clopidogrel (PLAVIX) 75 MG tablet Take 75 mg by mouth daily.    [provider]  darunavir (PREZISTA) 800 MG tablet Take 800 mg by mouth daily. Take with Norvir and with a full meal.    [provider]  dolutegravir (TIVICAY) 50 MG tablet Take 50 mg by mouth 2 (two) times daily.    [provider]  emtricitabine-tenofovir AF (DESCOVY) 200-25 MG tablet Take 1 tablet by mouth daily. Take with Prezcobix.    [provider]  HEPATITIS A-HEP B RECOMB VAC IM Inject 1 mL into the muscle See admin instructions. Series consists of  3 intramuscular (deltoid) injections.  First dose to be given on the elected start date; second dose no less than 4 weeks after the first dose; third dose no less than 5 months after the 2nd dose.    [provider]  hydrocerin (EUCERIN) CREA Apply 1 application topically daily. Limit 120 grams/month.    [provider]  ritonavir (NORVIR) 100 MG TABS tablet Take 100 mg by mouth daily. Take with Prezista and with a full meal daily.    [provider]    Physical Exam: Vitals:   04/17/18  1855  BP: 136/86  Pulse: 83  Resp: 16  Temp: 99.8 F (37.7 C)  TempSrc: Oral  SpO2: 98%    Constitutional: NAD, calm, comfortable Eyes: PERRL, lids and conjunctivae normal ENMT: Mucous membranes are moist. Posterior pharynx clear of any exudate or lesions.Normal dentition.  Neck: normal, supple, no masses, no thyromegaly Respiratory: clear to auscultation bilaterally, no wheezing, no crackles. Normal respiratory effort. No accessory muscle use.  Cardiovascular: Regular rate and rhythm, no murmurs / rubs / gallops. No extremity edema. 2+ pedal pulses. No carotid bruits.  Abdomen: no tenderness, no masses palpated. No hepatosplenomegaly. Bowel sounds positive.  Musculoskeletal: no clubbing /  cyanosis. No joint deformity upper and lower extremities. Good ROM, no contractures. Normal muscle tone.  Skin: no rashes, lesions, ulcers. No induration Neurologic: CN 2-12 grossly intact. Sensation intact, DTR normal. Strength 5/5 in all 4.  Psychiatric: Normal judgment and insight. Alert and oriented x 3. Normal mood.    Labs on Admission: I have personally reviewed following labs and imaging studies  CBC: No results for input(s): WBC, NEUTROABS, HGB, HCT, MCV, PLT in the last 168 hours. Basic Metabolic Panel: No results for input(s): NA, K, CL, CO2, GLUCOSE, BUN, CREATININE, CALCIUM, MG, PHOS in the last 168 hours. GFR: CrCl cannot be calculated (Patient's most recent lab result is older than the maximum 21 days allowed.). Liver Function Tests: No results for input(s): AST, ALT, ALKPHOS, BILITOT, PROT, ALBUMIN in the last 168 hours. No results for input(s): LIPASE, AMYLASE in the last 168 hours. No results for input(s): AMMONIA in the last 168 hours. Coagulation Profile: No results for input(s): INR, PROTIME in the last 168 hours. Cardiac Enzymes: No results for input(s): CKTOTAL, CKMB, CKMBINDEX, TROPONINI in the last 168 hours. BNP (last 3 results) No results for input(s): PROBNP in the last 8760 hours. HbA1C: No results for input(s): HGBA1C in the last 72 hours. CBG: No results for input(s): GLUCAP in the last 168 hours. Lipid Profile: No results for input(s): CHOL, HDL, LDLCALC, TRIG, CHOLHDL, LDLDIRECT in the last 72 hours. Thyroid Function Tests: No results for input(s): TSH, T4TOTAL, FREET4, T3FREE, THYROIDAB in the last 72 hours. Anemia Panel: No results for input(s): VITAMINB12, FOLATE, FERRITIN, TIBC, IRON, RETICCTPCT in the last 72 hours. Urine analysis: No results found for: COLORURINE, APPEARANCEUR, LABSPEC, PHURINE, GLUCOSEU, HGBUR, BILIRUBINUR, KETONESUR, PROTEINUR, UROBILINOGEN, NITRITE, LEUKOCYTESUR  Radiological Exams on Admission: No results found.  EKG:  Independently reviewed.  Assessment/Plan Principal Problem:   Chest pain, rule out acute myocardial infarction Active Problems:   HIV (human immunodeficiency virus infection) (HCC)   Hypertension   Coronary artery disease   CKD (chronic kidney disease) stage 3, GFR 30-59 ml/min (HCC)    1. CP r/o - 1. Tele monitor 2. Cards consulting: LHC vs no LHC 3. NPO after MN 4. Tramadol PRN pain since NTG not helping 5. Heparin GTT 2. HIV - Continue HAART 3. HTN - continue BP meds 4. CKD stage 3-4 - 1. baseline creat ~2.5. 2. Cards giving IVF tonight 3. Obviously risk of contrast nephropathy is a big factor weighing in on decision to perform LHC or not.  DVT prophylaxis: heparin gtt Code Status: Full Family Communication: No family in room Disposition Plan: Return to [place] after admit. Consults called: Cardiology Admission status: Admit to inpatient - IP status for LHC  Severity of Illness: The appropriate patient status for this patient is INPATIENT. Inpatient status is judged to be reasonable and necessary in order to provide the required  intensity of service to ensure the patient's safety. The patient's presenting symptoms, physical exam findings, and initial radiographic and laboratory data in the context of their chronic comorbidities is felt to place them at high risk for further clinical deterioration. Furthermore, it is not anticipated that the patient will be medically stable for discharge from the hospital within 2 midnights of admission. The following factors support the patient status of inpatient.   " The patient's presenting symptoms include Chest pain. " The initial radiographic and laboratory data are worrisome because of positive troponin. " The chronic co-morbidities include known CAD, CKD stage 3-4 complicating the need to give IV contrast for LHC.   * I certify that at the point of admission it is my clinical judgment that the patient will require inpatient hospital  care spanning beyond 2 midnights from the point of admission due to high intensity of service, high risk for further deterioration and high frequency of surveillance required.Etta Quill DO [facility] Pager [phone] Only works nights!  If 7AM-7PM, please contact the primary day team physician taking care of patient  [email or web address] Password [facility]  04/17/2018, 8:17 PM

## 2018-04-17 NOTE — Progress Notes (Signed)
ANTICOAGULATION CONSULT NOTE - Initial Consult  Pharmacy Consult for Heparin  Indication: chest pain/ACS  Allergies  Allergen Reactions  . Sulfa Antibiotics     Pt states it made him go into coma    Patient Measurements: Weight: 169 lb 12.1 oz (77 kg)   Vital Signs: Temp: 99.8 F (37.7 C) (10/29 1855) Temp Source: Oral (10/29 1855) BP: 136/86 (10/29 1855) Pulse Rate: 83 (10/29 1855)  Labs: No results for input(s): HGB, HCT, PLT, APTT, LABPROT, INR, HEPARINUNFRC, HEPRLOWMOCWT, CREATININE, CKTOTAL, CKMB, TROPONINI in the last 72 hours.  CrCl cannot be calculated (Patient's most recent lab result is older than the maximum 21 days allowed.).   Medical History: Past Medical History:  Diagnosis Date  . CAD (coronary artery disease)   . Chronic bronchitis ([place])   . Chronic diastolic heart failure ([place])   . CKD (chronic kidney disease), stage IV ([place])   . HIV (human immunodeficiency virus infection) ([place])   . Hyperlipidemia   . Hypertension   . Myocardial infarction ([place]) 2014   hx of 2 mi's per pt  . OSA on CPAP      Assessment: 52yom with Hx CAD  admitted for chest pain.  Will start on heparin drip.    Goal of Therapy:  Heparin level 0.3-0.7 units/ml Monitor platelets by anticoagulation protocol: Yes   Plan:  Heparin drip 1100 uts/hr Daily HL CBC  Bonnita Nasuti Pharm.D. CPP, BCPS Clinical Pharmacist [phone] 04/17/2018 10:14 PM

## 2018-04-18 ENCOUNTER — Encounter (HOSPITAL_COMMUNITY): Payer: Self-pay

## 2018-04-18 ENCOUNTER — Inpatient Hospital Stay (HOSPITAL_COMMUNITY)

## 2018-04-18 DIAGNOSIS — R079 Chest pain, unspecified: Secondary | ICD-10-CM

## 2018-04-18 DIAGNOSIS — I2510 Atherosclerotic heart disease of native coronary artery without angina pectoris: Secondary | ICD-10-CM

## 2018-04-18 LAB — LIPID PANEL
Cholesterol: 159 mg/dL (ref 0–200)
HDL: 35 mg/dL — ABNORMAL LOW (ref >40)
LDL Cholesterol: 94 mg/dL (ref 0–99)
TRIGLYCERIDES: 152 mg/dL — AB (ref <150)
Total CHOL/HDL Ratio: 4.5 RATIO
VLDL: 30 mg/dL (ref 0–40)

## 2018-04-18 LAB — BASIC METABOLIC PANEL
ANION GAP: 3 — AB (ref 5–15)
BUN: 38 mg/dL — AB (ref 6–20)
CHLORIDE: 110 mmol/L (ref 98–111)
CO2: 22 mmol/L (ref 22–32)
Calcium: 8.1 mg/dL — ABNORMAL LOW (ref 8.9–10.3)
Creatinine, Ser: 2.6 mg/dL — ABNORMAL HIGH (ref 0.61–1.24)
GFR calc Af Amer: 31 mL/min — ABNORMAL LOW (ref >60)
GFR, EST NON AFRICAN AMERICAN: 27 mL/min — AB (ref >60)
Glucose, Bld: 94 mg/dL (ref 70–99)
POTASSIUM: 5.2 mmol/L — AB (ref 3.5–5.1)
SODIUM: 135 mmol/L (ref 135–145)

## 2018-04-18 LAB — CBC
HCT: 33.9 % — ABNORMAL LOW (ref 39.0–52.0)
HEMOGLOBIN: 11.3 g/dL — AB (ref 13.0–17.0)
MCH: 28 pg (ref 26.0–34.0)
MCHC: 33.3 g/dL (ref 30.0–36.0)
MCV: 83.9 fL (ref 80.0–100.0)
NRBC: 0 % (ref 0.0–0.2)
PLATELETS: 108 10*3/uL — AB (ref 150–400)
RBC: 4.04 MIL/uL — AB (ref 4.22–5.81)
RDW: 13.2 % (ref 11.5–15.5)
WBC: 4.9 10*3/uL (ref 4.0–10.5)

## 2018-04-18 LAB — MAGNESIUM: MAGNESIUM: 1.6 mg/dL — AB (ref 1.7–2.4)

## 2018-04-18 LAB — ECHOCARDIOGRAM COMPLETE: WEIGHTICAEL: 2716.07 [oz_av]

## 2018-04-18 LAB — HEPARIN LEVEL (UNFRACTIONATED)
Heparin Unfractionated: 0.46 IU/mL (ref 0.30–0.70)
Heparin Unfractionated: 0.39 IU/mL (ref 0.30–0.70)

## 2018-04-18 MED ORDER — ATORVASTATIN CALCIUM 40 MG PO TABS
40.0000 mg | ORAL_TABLET | Freq: Every day | ORAL | Status: DC
  Administered 2018-04-18: 40 mg via ORAL
  Filled 2018-04-18: qty 1

## 2018-04-18 MED ORDER — TECHNETIUM TC 99M TETROFOSMIN IV KIT
10.0000 | PACK | Freq: Once | INTRAVENOUS | Status: AC | PRN
  Administered 2018-04-18: 10 via INTRAVENOUS

## 2018-04-18 MED ORDER — TECHNETIUM TC 99M TETROFOSMIN IV KIT
30.0000 | PACK | Freq: Once | INTRAVENOUS | Status: AC | PRN
  Administered 2018-04-18: 30 via INTRAVENOUS

## 2018-04-18 MED ORDER — ATORVASTATIN CALCIUM 40 MG PO TABS: 40.0000 mg | ORAL_TABLET | Freq: Every day | ORAL | 0 refills | Status: AC

## 2018-04-18 MED ORDER — MAGNESIUM OXIDE 400 (241.3 MG) MG PO TABS
800.0000 mg | ORAL_TABLET | Freq: Two times a day (BID) | ORAL | Status: DC
  Administered 2018-04-18: 800 mg via ORAL
  Filled 2018-04-18: qty 2

## 2018-04-18 MED ORDER — REGADENOSON 0.4 MG/5ML IV SOLN
0.4000 mg | Freq: Once | INTRAVENOUS | Status: AC
  Administered 2018-04-18: 0.4 mg via INTRAVENOUS

## 2018-04-18 MED ORDER — REGADENOSON 0.4 MG/5ML IV SOLN
INTRAVENOUS | Status: AC
  Filled 2018-04-18: qty 5

## 2018-04-18 NOTE — Progress Notes (Addendum)
DAILY PROGRESS NOTE   Patient Name: Joseph Quinn Date of Encounter: 04/18/2018  Chief Complaint   Chest pain has resolved  Patient Profile   Joseph Quinn is a 52 y.o. male with a hx of CAD, HIV/AIDS, HTN, HLD, chronic diastolic heart failure, and CKD stage IV who is being seen today for the evaluation of chest pain transferred from [facility] at the request of Dr. Alcario Drought.  Subjective   Pain is better overnight. Present for bedside echo today - normal LVEF without WMA's. Troponin flat elevated. Unfortunately, creatinine remains at 2.6. Risk of contrast nephropathy with cath is high.  Objective   Vitals:   04/17/18 2214 04/17/18 2341 04/18/18 0414 04/18/18 0847  BP: (!) 143/84 (!) 145/77 137/88 118/73  Pulse: 76 74 69 66  Resp: (!) _0 Temp: 99.8 F (37.7 C) 99.5 F (37.5 C) 99 F (37.2 C)   TempSrc: Oral Oral Oral   SpO2:  100% 99%   Weight:        Intake/Output Summary (Last 24 hours) at 04/18/2018 0911 Last data filed at 04/18/2018 5449 Gross per 24 hour  Intake 953 ml  Output -  Net 953 ml   Filed Weights   04/17/18 2200  Weight: 77 kg    Physical Exam   General appearance: alert and no distress Lungs: clear to auscultation bilaterally Heart: regular rate and rhythm Extremities: extremities normal, atraumatic, no cyanosis or edema Neurologic: Grossly normal  Inpatient Medications    Scheduled Meds: . amLODipine  10 mg Oral Daily  . aspirin EC  81 mg Oral Daily  . atorvastatin  20 mg Oral q1800  . carvedilol  6.25 mg Oral BID WC  . clopidogrel  75 mg Oral Daily  . darunavir  800 mg Oral Q breakfast  . dolutegravir  50 mg Oral BID  . emtricitabine-tenofovir AF  1 tablet Oral Daily  . hydrALAZINE  10 mg Oral Q6H  . isosorbide mononitrate  30 mg Oral Daily  . nortriptyline  25 mg Oral QHS  . ritonavir  100 mg Oral Daily    Continuous Infusions: . sodium chloride 100 mL/hr at 04/18/18 0845  . heparin 1,100 Units/hr  (04/18/18 0328)    PRN Meds: acetaminophen, alum & mag hydroxide-simeth, camphor-menthol, hydrocerin, ondansetron (ZOFRAN) IV, traMADol   Labs   Results for orders placed or performed during the hospital encounter of 04/17/18 (from the past 48 hour(s))  MRSA PCR Screening     Status: None   Collection Time: 04/17/18  8:10 PM  Result Value Ref Range   MRSA by PCR NEGATIVE NEGATIVE    Comment:        The GeneXpert MRSA Assay (FDA approved for NASAL specimens only), is one component of a comprehensive MRSA colonization surveillance program. It is not intended to diagnose MRSA infection nor to guide or monitor treatment for MRSA infections. Performed at [facility], [place] [address], [place], [place] [address]   Heparin level (unfractionated)     Status: None   Collection Time: 04/18/18  7:25 AM  Result Value Ref Range   Heparin Unfractionated 0.46 0.30 - 0.70 IU/mL    Comment: (NOTE) If heparin results are below expected values, and patient dosage has  been confirmed, suggest follow up testing of antithrombin III levels. Performed at [facility], [place] [address], [place], [place] [address]   Lipid panel     Status: Abnormal   Collection Time: 04/18/18  7:25 AM  Result Value Ref Range   Cholesterol 159 0 - 200 mg/dL   Triglycerides 152 (H) <150 mg/dL   HDL 35 (L) >40 mg/dL   Total CHOL/HDL Ratio 4.5 RATIO   VLDL 30 0 - 40 mg/dL   LDL Cholesterol 94 0 - 99 mg/dL    Comment:        Total Cholesterol/HDL:CHD Risk Coronary Heart Disease Risk Table                     Men   Women  1/2 Average Risk   3.4   3.3  Average Risk       5.0   4.4  2 X Average Risk   9.6   7.1  3 X Average Risk  23.4   11.0        Use the calculated Patient Ratio above and the CHD Risk Table to determine the patient's CHD Risk.        ATP III CLASSIFICATION (LDL):  <100     mg/dL   Optimal  100-129  mg/dL   Near or Above                    Optimal  130-159  mg/dL   Borderline   160-189  mg/dL   High  >190     mg/dL   Very High Performed at [facility] [address] [place], [place] [address]   Basic metabolic panel     Status: Abnormal   Collection Time: 04/18/18  7:25 AM  Result Value Ref Range   Sodium 135 135 - 145 mmol/L   Potassium 5.2 (H) 3.5 - 5.1 mmol/L   Chloride 110 98 - 111 mmol/L   CO2 22 22 - 32 mmol/L   Glucose, Bld 94 70 - 99 mg/dL   BUN 38 (H) 6 - 20 mg/dL   Creatinine, Ser 2.60 (H) 0.61 - 1.24 mg/dL   Calcium 8.1 (L) 8.9 - 10.3 mg/dL   GFR calc non Af Amer 27 (L) >60 mL/min   GFR calc Af Amer 31 (L) >60 mL/min    Comment: (NOTE) The eGFR has been calculated using the CKD EPI equation. This calculation has not been validated in all clinical situations. eGFR's persistently <60 mL/min signify possible Chronic Kidney Disease.    Anion gap 3 (L) 5 - 15    Comment: Performed at [facility], [place] [address], [place], [place] [address]  Magnesium     Status: Abnormal   Collection Time: 04/18/18  7:25 AM  Result Value Ref Range   Magnesium 1.6 (L) 1.7 - 2.4 mg/dL    Comment: Performed at [facility] [address] [place], [place] [address]    ECG   N/A  Telemetry   Sinus rhythm - Personally Reviewed  Radiology    Dg Chest Port 1 View  Result Date: 04/18/2018 CLINICAL DATA:  Preoperative examination prior to cardiac catheterization. History of left-sided chest, shoulder, and arm pain. History of coronary artery disease and stent placement, HIV, current smoker. EXAM: PORTABLE CHEST 1 VIEW COMPARISON:  Portable chest x-ray of April 14, 2018 FINDINGS: The lungs are well-expanded and clear. The heart is top-normal in size. The pulmonary vascularity is normal. The mediastinum is normal in width. The bony thorax exhibits no acute abnormality. IMPRESSION: Top-normal cardiac size. No pulmonary edema nor other acute cardiopulmonary abnormality. Electronically Signed   By: David  Martinique M.D.  On: 04/18/2018 07:30     Cardiac Studies   Pending echo  Assessment   1. Principal Problem: 2.   Chest pain, rule out acute myocardial infarction 3. Active Problems: 4.   HIV (human immunodeficiency virus infection) ([place]) 5.   Hypertension 6.   Coronary artery disease 7.   CKD (chronic kidney disease) stage 3, GFR 30-59 ml/min (HCC) 8.   Plan   1. Joseph Quinn chest pain has resolved. Troponin flat elevated, on heparin. He has known CAD - will need ischemic evaluation, however, given significant CKD, would favor stress testing. He would need to have significant reversible ischemia before we could consider cath given the unfavorable risk/benefit ratio. He is agreeable to this. LDL 96 - goal <70, increase atorvastatin to 40 mg QHS. This is probably the least offensive high-potency statin in combination with his HAART therapy. Plan lexiscan myoview today. If low risk, would consider intensifying antianginal therapies and may be discharged later today.  Time Spent Directly with Patient:  I have spent a total of 25 minutes with the patient reviewing hospital notes, telemetry, EKGs, labs and examining the patient as well as establishing an assessment and plan that was discussed personally with the patient.  > 50% of time was spent in direct patient care.  Length of Stay:  LOS: 1 day   Pixie Casino, MD, [facility], [place] Director of the Advanced Lipid Disorders &  Cardiovascular Risk Reduction Clinic Diplomate of the American Board of Clinical Lipidology Attending Cardiologist  Direct Dial: [phone]  Fax: [phone]  Website:  [email or web address] 04/18/2018, 9:11 AM

## 2018-04-18 NOTE — Progress Notes (Signed)
ANTICOAGULATION CONSULT NOTE - Follow Up Consult  Pharmacy Consult for Heparin Indication: chest pain/ACS  Allergies  Allergen Reactions  . Sulfa Antibiotics     Pt states it made him go into coma    Patient Measurements: Weight: 169 lb 12.1 oz (77 kg) Heparin Dosing Weight:   Vital Signs: Temp: 98.1 F (36.7 C) (10/30 1351) Temp Source: Oral (10/30 1351) BP: 110/73 (10/30 1351) Pulse Rate: 77 (10/30 1351)  Labs: Recent Labs    04/18/18 0725 04/18/18 1314  HGB 11.3*  --   HCT 33.9*  --   PLT 108*  --   HEPARINUNFRC 0.46 0.39  CREATININE 2.60*  --     CrCl cannot be calculated (Unknown ideal weight.).  Assessment:  Anticoag: ACS heparin. Heparin level 0.46 remains in goal range 0.39.Marland Kitchen Hgb 11.3 Plts only 108 watch closely  Goal of Therapy:  Heparin level 0.3-0.7 units/ml Monitor platelets by anticoagulation protocol: Yes   Plan:  Heparin 1100 uts/hr  Daily HL and CBC  Taris Galindo S. Alford [place], PharmD, BCPS Clinical Staff Pharmacist Pager [phone]  Eilene Ghazi Stillinger 04/18/2018,2:49 PM

## 2018-04-18 NOTE — Progress Notes (Signed)
[facility]. IV and telemetry box removed. AVS reviewed with pt. Pt verbalized understanding. Pt left with all of his belongings. Pt discharged via wheelchair and was accompanied by this RN and 2 officers.   Ara Kussmaul BSN, RN

## 2018-04-18 NOTE — Progress Notes (Signed)
  Echocardiogram 2D Echocardiogram has been performed.  Joseph Quinn 04/18/2018, 9:26 AM

## 2018-04-18 NOTE — Discharge Summary (Signed)
Physician Discharge Summary  Murl Golladay UJW:[id number] DOB: 1966/01/31 DOA: 04/17/2018  PCP: Patient, No Pcp Per  Admit date: 04/17/2018 Discharge date: 04/18/2018  Admitted From: Jail Disposition:  Jail  Recommendations for Outpatient Follow-up:  1. Follow up with PCP in 1-2 weeks 2. Please obtain BMP/CBC in one week your next doctors visit.  3. Lipitor 40mg  po daily  4. Follow up outpatient with Cardiology in 2 weeks, appointment will be made by their service.   Home Health: Equipment/Devices: Discharge Condition: Stable CODE STATUS:  Diet recommendation:   Brief/Interim Summary: 52 year old with past medical history of HIV, essential hypertension, CKD stage III, CAD status post stent in 2014 in 2018 came to the hospital with complains of left-sided chest pain radiating to his left shoulder.  He was admitted to [facility] his troponins remained slightly high but flat 0.1.  He was transferred here for further management.  He was seen by cardiology and underwent nuclear medicine stress test on 10/30 which ended up being normal study with low risk. Started on Atorvastatin 40mg  po daily. Renal function remained stable at 2.5 which is his baseline.  Patient is medically stable and chest pain free. He is stable to be discharged.    Discharge Diagnoses:  Principal Problem:   Chest pain, rule out acute myocardial infarction Active Problems:   HIV (human immunodeficiency virus infection) ([place])   Hypertension   Coronary artery disease   CKD (chronic kidney disease) stage 3, GFR 30-59 ml/min (HCC)  Atypical chest pain; resolved History of coronary artery disease status post PCI in 2014 in 2019 - Currently he is chest pain-free.  His troponins has remained flat 0.1.  EKG does not show any ischemic changes - Lexiscan Myoview performed- No ST T deviation, normal study, low risk -On aspirin 81 mg daily, Plavix 75 mg daily -Currently he is on heparin drip, this can be  discontinued once cleared by cardiology. -Atorvastatin 40 mg daily - Continue Coreg 6.25 mg twice daily, hydralazine 10 mg every 6 hours, isosorbide mononitrate 30 mg daily  Hypomagnesemia -Replete.  CKD stage III -Baseline creatinine appears to be around 2.5, today's 2.6.    History of HIV -Continue his HAART regimen.  Last CD4 count is 114  Hyperlipidemia -Continue atorvastatin 40 mg daily  Chronic diastolic congestive heart failure, ejection fraction 56%, grade 2 diastolic dysfunction - Continues on medications, Coreg, isosorbide mononitrate.  Aspirin and statin. -Echocardiogram 04/18/2018-ejection fraction 21%, grade 2 diastolic dysfunction.   On Hep Drip while here Full Code  Discharge   Discharge Instructions   Allergies as of 04/18/2018      Reactions   Sulfa Antibiotics    Pt states it made him go into coma      Medication List    TAKE these medications   amLODipine 10 MG tablet Commonly known as:  NORVASC Take 10 mg by mouth daily.   aspirin EC 81 MG tablet Take 81 mg by mouth daily.   atorvastatin 40 MG tablet Commonly known as:  LIPITOR Take 1 tablet (40 mg total) by mouth daily at 6 PM. What changed:    medication strength  how much to take  when to take this   camphor-menthol lotion Commonly known as:  SARNA Apply 1 application topically daily. Limit 1 bottle/month.   carvedilol 6.25 MG tablet Commonly known as:  COREG Take 6.25 mg by mouth every 12 (twelve) hours.   cholecalciferol 1000 units tablet Commonly known as:  VITAMIN D Take 3,000 Units by  mouth daily.   clopidogrel 75 MG tablet Commonly known as:  PLAVIX Take 75 mg by mouth daily.   darunavir 800 MG tablet Commonly known as:  PREZISTA Take 800 mg by mouth daily. Take with Norvir and with a full meal.   dolutegravir 50 MG tablet Commonly known as:  TIVICAY Take 50 mg by mouth 2 (two) times daily.   doxazosin 2 MG tablet Commonly known as:  CARDURA Take 2 mg  by mouth daily.   emtricitabine-tenofovir AF 200-25 MG tablet Commonly known as:  DESCOVY Take 1 tablet by mouth daily. Take with Prezcobix.   hydrALAZINE 10 MG tablet Commonly known as:  APRESOLINE Take 10 mg by mouth 4 (four) times daily.   hydrocerin Crea Apply 1 application topically daily. Limit 120 grams/month.   isosorbide mononitrate 30 MG 24 hr tablet Commonly known as:  IMDUR Take 30 mg by mouth daily.   nitroGLYCERIN 0.4 MG SL tablet Commonly known as:  NITROSTAT Place 0.4 mg under the tongue every 5 (five) minutes as needed for chest pain.   nortriptyline 25 MG capsule Commonly known as:  PAMELOR Take 25 mg by mouth at bedtime.   ritonavir 100 MG Tabs tablet Commonly known as:  NORVIR Take 100 mg by mouth daily. Take with Prezista and with a full meal daily.      Follow-up Information    [facility]. Schedule an appointment as soon as possible for a visit in 1 week(s).   Contact information: [place] [address] [phone]         Allergies  Allergen Reactions  . Sulfa Antibiotics     Pt states it made him go into coma    You were cared for by a hospitalist during your hospital stay. If you have any questions about your discharge medications or the care you received while you were in the hospital after you are discharged, you can call the unit and asked to speak with the hospitalist on call if the hospitalist that took care of you is not available. Once you are discharged, your primary care physician will handle any further medical issues. Please note that no refills for any discharge medications will be authorized once you are discharged, as it is imperative that you return to your primary care physician (or establish a relationship with a primary care physician if you do not have one) for your aftercare needs so that they can reassess your need for medications and monitor your lab  values.  Consultations:  Cardiology   Procedures/Studies: Nm Myocar Multi W/spect W/wall Motion / Ef  Result Date: 04/18/2018  There was no ST segment deviation noted during stress.  The study is normal.  This is a low risk study.  The left ventricular ejection fraction is mildly decreased (45-54%).  Low risk stress nuclear study with normal perfusion and low normal left ventricular global systolic function.   Dg Chest Port 1 View  Result Date: 04/18/2018 CLINICAL DATA:  Preoperative examination prior to cardiac catheterization. History of left-sided chest, shoulder, and arm pain. History of coronary artery disease and stent placement, HIV, current smoker. EXAM: PORTABLE CHEST 1 VIEW COMPARISON:  Portable chest x-ray of April 14, 2018 FINDINGS: The lungs are well-expanded and clear. The heart is top-normal in size. The pulmonary vascularity is normal. The mediastinum is normal in width. The bony thorax exhibits no acute abnormality. IMPRESSION: Top-normal cardiac size. No pulmonary edema nor other acute cardiopulmonary abnormality.  Electronically Signed   By: David  Martinique M.D.   On: 04/18/2018 07:30      Subjective: No complaints, feels better.    General = no fevers, chills, dizziness, malaise, fatigue HEENT/EYES = negative for pain, redness, loss of vision, double vision, blurred vision, loss of hearing, sore throat, hoarseness, dysphagia Cardiovascular= negative for chest pain, palpitation, murmurs, lower extremity swelling Respiratory/lungs= negative for shortness of breath, cough, hemoptysis, wheezing, mucus production Gastrointestinal= negative for nausea, vomiting,, abdominal pain, melena, hematemesis Genitourinary= negative for Dysuria, Hematuria, Change in Urinary Frequency MSK = Negative for arthralgia, myalgias, Back Pain, Joint swelling  Neurology= Negative for headache, seizures, numbness, tingling  Psychiatry= Negative for anxiety, depression, suicidal and  homocidal ideation Allergy/Immunology= Medication/Food allergy as listed  Skin= Negative for Rash, lesions, ulcers, itching   Discharge Exam: Vitals:   04/18/18 1052 04/18/18 1351  BP: 116/63 110/73  Pulse: 76 77  Resp:  15  Temp:  98.1 F (36.7 C)  SpO2:  99%   Vitals:   04/18/18 1048 04/18/18 1050 04/18/18 1052 04/18/18 1351  BP: 124/73 120/68 116/63 110/73  Pulse: 69 77 76 77  Resp:    15  Temp:    98.1 F (36.7 C)  TempSrc:    Oral  SpO2:    99%  Weight:        General: Pt is alert, awake, not in acute distress Cardiovascular: RRR, S1/S2 +, no rubs, no gallops Respiratory: CTA bilaterally, no wheezing, no rhonchi Abdominal: Soft, NT, ND, bowel sounds + Extremities: no edema, no cyanosis    The results of significant diagnostics from this hospitalization (including imaging, microbiology, ancillary and laboratory) are listed below for reference.     Microbiology: Recent Results (from the past 240 hour(s))  MRSA PCR Screening     Status: None   Collection Time: 04/17/18  8:10 PM  Result Value Ref Range Status   MRSA by PCR NEGATIVE NEGATIVE Final    Comment:        The GeneXpert MRSA Assay (FDA approved for NASAL specimens only), is one component of a comprehensive MRSA colonization surveillance program. It is not intended to diagnose MRSA infection nor to guide or monitor treatment for MRSA infections. Performed at [facility], [place] [address] [place], [place] [address]      Labs: BNP (last 3 results) No results for input(s): BNP in the last 8760 hours. Basic Metabolic Panel: Recent Labs  Lab 04/18/18 0725  NA 135  K 5.2*  CL 110  CO2 22  GLUCOSE 94  BUN 38*  CREATININE 2.60*  CALCIUM 8.1*  MG 1.6*   Liver Function Tests: No results for input(s): AST, ALT, ALKPHOS, BILITOT, PROT, ALBUMIN in the last 168 hours. No results for input(s): LIPASE, AMYLASE in the last 168 hours. No results for input(s): AMMONIA in the last 168  hours. CBC: Recent Labs  Lab 04/18/18 0725  WBC 4.9  HGB 11.3*  HCT 33.9*  MCV 83.9  PLT 108*   Cardiac Enzymes: No results for input(s): CKTOTAL, CKMB, CKMBINDEX, TROPONINI in the last 168 hours. BNP: Invalid input(s): POCBNP CBG: No results for input(s): GLUCAP in the last 168 hours. D-Dimer No results for input(s): DDIMER in the last 72 hours. Hgb A1c No results for input(s): HGBA1C in the last 72 hours. Lipid Profile Recent Labs    04/18/18 0725  CHOL 159  HDL 35*  LDLCALC 94  TRIG 152*  CHOLHDL 4.5   Thyroid function studies No results  for input(s): TSH, T4TOTAL, T3FREE, THYROIDAB in the last 72 hours.  Invalid input(s): FREET3 Anemia work up No results for input(s): VITAMINB12, FOLATE, FERRITIN, TIBC, IRON, RETICCTPCT in the last 72 hours. Urinalysis No results found for: COLORURINE, APPEARANCEUR, [place], [place], [place], [place], [place], [place], PROTEINUR, UROBILINOGEN, NITRITE, LEUKOCYTESUR Sepsis Labs Invalid input(s): PROCALCITONIN,  WBC,  LACTICIDVEN Microbiology Recent Results (from the past 240 hour(s))  MRSA PCR Screening     Status: None   Collection Time: 04/17/18  8:10 PM  Result Value Ref Range Status   MRSA by PCR NEGATIVE NEGATIVE Final    Comment:        The GeneXpert MRSA Assay (FDA approved for NASAL specimens only), is one component of a comprehensive MRSA colonization surveillance program. It is not intended to diagnose MRSA infection nor to guide or monitor treatment for MRSA infections. Performed at [facility], [place] [address], [place], [place] [address]      Time coordinating discharge:  I have spent 35 minutes face to face with the patient and on the ward discussing the patients care, assessment, plan and disposition with other care givers. >50% of the time was devoted counseling the patient about the risks and benefits of treatment/Discharge disposition and coordinating care.   SIGNED:   Damita Lack, MD  [facility] 04/18/2018, 3:58 PM Pager   If 7PM-7AM, please contact night-coverage [email or web address] Password [email or web address]

## 2018-04-18 NOTE — Progress Notes (Signed)
Admissions nurse informed me that patient refused to discuss medical history in presence of the police officer accompanying him. Admissions history was not able to be taken. Lajoyce Corners, RN

## 2018-12-21 ENCOUNTER — Observation Stay (HOSPITAL_COMMUNITY)

## 2018-12-21 ENCOUNTER — Observation Stay (HOSPITAL_COMMUNITY)
Admission: AD | Admit: 2018-12-21 | Discharge: 2018-12-22 | Disposition: A | Discharge status: Home / Self Care | Source: Other Acute Inpatient Hospital | Attending: Internal Medicine | Admitting: Internal Medicine

## 2018-12-21 ENCOUNTER — Encounter (HOSPITAL_COMMUNITY): Payer: Self-pay | Admitting: Internal Medicine

## 2018-12-21 ENCOUNTER — Other Ambulatory Visit: Payer: Self-pay

## 2018-12-21 DIAGNOSIS — I10 Essential (primary) hypertension: Secondary | ICD-10-CM | POA: Diagnosis present

## 2018-12-21 DIAGNOSIS — N184 Chronic kidney disease, stage 4 (severe): Secondary | ICD-10-CM | POA: Insufficient documentation

## 2018-12-21 DIAGNOSIS — I252 Old myocardial infarction: Secondary | ICD-10-CM | POA: Insufficient documentation

## 2018-12-21 DIAGNOSIS — Z21 Asymptomatic human immunodeficiency virus [HIV] infection status: Secondary | ICD-10-CM

## 2018-12-21 DIAGNOSIS — I5032 Chronic diastolic (congestive) heart failure: Secondary | ICD-10-CM | POA: Insufficient documentation

## 2018-12-21 DIAGNOSIS — Z7982 Long term (current) use of aspirin: Secondary | ICD-10-CM | POA: Insufficient documentation

## 2018-12-21 DIAGNOSIS — Z955 Presence of coronary angioplasty implant and graft: Secondary | ICD-10-CM | POA: Diagnosis not present

## 2018-12-21 DIAGNOSIS — I5030 Unspecified diastolic (congestive) heart failure: Secondary | ICD-10-CM

## 2018-12-21 DIAGNOSIS — B20 Human immunodeficiency virus [HIV] disease: Secondary | ICD-10-CM | POA: Diagnosis present

## 2018-12-21 DIAGNOSIS — I25118 Atherosclerotic heart disease of native coronary artery with other forms of angina pectoris: Secondary | ICD-10-CM

## 2018-12-21 DIAGNOSIS — E785 Hyperlipidemia, unspecified: Secondary | ICD-10-CM | POA: Diagnosis present

## 2018-12-21 DIAGNOSIS — N183 Chronic kidney disease, stage 3 unspecified: Secondary | ICD-10-CM | POA: Diagnosis present

## 2018-12-21 DIAGNOSIS — I130 Hypertensive heart and chronic kidney disease with heart failure and stage 1 through stage 4 chronic kidney disease, or unspecified chronic kidney disease: Secondary | ICD-10-CM | POA: Insufficient documentation

## 2018-12-21 DIAGNOSIS — Z7902 Long term (current) use of antithrombotics/antiplatelets: Secondary | ICD-10-CM | POA: Insufficient documentation

## 2018-12-21 DIAGNOSIS — R7989 Other specified abnormal findings of blood chemistry: Secondary | ICD-10-CM

## 2018-12-21 DIAGNOSIS — I2510 Atherosclerotic heart disease of native coronary artery without angina pectoris: Secondary | ICD-10-CM | POA: Diagnosis not present

## 2018-12-21 DIAGNOSIS — F17210 Nicotine dependence, cigarettes, uncomplicated: Secondary | ICD-10-CM | POA: Insufficient documentation

## 2018-12-21 DIAGNOSIS — Z79899 Other long term (current) drug therapy: Secondary | ICD-10-CM | POA: Insufficient documentation

## 2018-12-21 DIAGNOSIS — G4733 Obstructive sleep apnea (adult) (pediatric): Secondary | ICD-10-CM | POA: Diagnosis not present

## 2018-12-21 DIAGNOSIS — Z8249 Family history of ischemic heart disease and other diseases of the circulatory system: Secondary | ICD-10-CM | POA: Diagnosis not present

## 2018-12-21 DIAGNOSIS — N189 Chronic kidney disease, unspecified: Secondary | ICD-10-CM

## 2018-12-21 DIAGNOSIS — N179 Acute kidney failure, unspecified: Secondary | ICD-10-CM | POA: Diagnosis not present

## 2018-12-21 DIAGNOSIS — Z720 Tobacco use: Secondary | ICD-10-CM | POA: Diagnosis present

## 2018-12-21 DIAGNOSIS — R0789 Other chest pain: Principal | ICD-10-CM | POA: Insufficient documentation

## 2018-12-21 DIAGNOSIS — R079 Chest pain, unspecified: Secondary | ICD-10-CM | POA: Diagnosis present

## 2018-12-21 DIAGNOSIS — I5031 Acute diastolic (congestive) heart failure: Secondary | ICD-10-CM

## 2018-12-21 LAB — RAPID URINE DRUG SCREEN, HOSP PERFORMED
Amphetamines: NOT DETECTED
Barbiturates: NOT DETECTED
Benzodiazepines: NOT DETECTED
Cocaine: NOT DETECTED
Opiates: POSITIVE — AB
Tetrahydrocannabinol: NOT DETECTED

## 2018-12-21 LAB — COMPREHENSIVE METABOLIC PANEL
ALT: 11 U/L (ref 0–44)
AST: 13 U/L — ABNORMAL LOW (ref 15–41)
Albumin: 2.9 g/dL — ABNORMAL LOW (ref 3.5–5.0)
Alkaline Phosphatase: 69 U/L (ref 38–126)
Anion gap: 7 (ref 5–15)
BUN: 45 mg/dL — ABNORMAL HIGH (ref 6–20)
CO2: 19 mmol/L — ABNORMAL LOW (ref 22–32)
Calcium: 8.2 mg/dL — ABNORMAL LOW (ref 8.9–10.3)
Chloride: 116 mmol/L — ABNORMAL HIGH (ref 98–111)
Creatinine, Ser: 3.37 mg/dL — ABNORMAL HIGH (ref 0.61–1.24)
GFR calc Af Amer: 23 mL/min — ABNORMAL LOW (ref >60)
GFR calc non Af Amer: 20 mL/min — ABNORMAL LOW (ref >60)
Glucose, Bld: 94 mg/dL (ref 70–99)
Potassium: 5.8 mmol/L — ABNORMAL HIGH (ref 3.5–5.1)
Sodium: 142 mmol/L (ref 135–145)
Total Bilirubin: 0.8 mg/dL (ref 0.3–1.2)
Total Protein: 5.4 g/dL — ABNORMAL LOW (ref 6.5–8.1)

## 2018-12-21 LAB — CBC WITH DIFFERENTIAL/PLATELET
Abs Immature Granulocytes: 0.03 10*3/uL (ref 0.00–0.07)
Basophils Absolute: 0 10*3/uL (ref 0.0–0.1)
Basophils Relative: 0 %
Eosinophils Absolute: 0.1 10*3/uL (ref 0.0–0.5)
Eosinophils Relative: 1 %
HCT: 35.4 % — ABNORMAL LOW (ref 39.0–52.0)
Hemoglobin: 11.8 g/dL — ABNORMAL LOW (ref 13.0–17.0)
Immature Granulocytes: 0 %
Lymphocytes Relative: 30 %
Lymphs Abs: 2.7 10*3/uL (ref 0.7–4.0)
MCH: 30.5 pg (ref 26.0–34.0)
MCHC: 33.3 g/dL (ref 30.0–36.0)
MCV: 91.5 fL (ref 80.0–100.0)
Monocytes Absolute: 0.5 10*3/uL (ref 0.1–1.0)
Monocytes Relative: 6 %
Neutro Abs: 5.4 10*3/uL (ref 1.7–7.7)
Neutrophils Relative %: 63 %
Platelets: 149 10*3/uL — ABNORMAL LOW (ref 150–400)
RBC: 3.87 MIL/uL — ABNORMAL LOW (ref 4.22–5.81)
RDW: 14.3 % (ref 11.5–15.5)
WBC: 8.7 10*3/uL (ref 4.0–10.5)
nRBC: 0 % (ref 0.0–0.2)

## 2018-12-21 LAB — BRAIN NATRIURETIC PEPTIDE: B Natriuretic Peptide: 269.5 pg/mL — ABNORMAL HIGH (ref 0.0–100.0)

## 2018-12-21 LAB — HEMOGLOBIN A1C
Hgb A1c MFr Bld: 5.2 % (ref 4.8–5.6)
Mean Plasma Glucose: 102.54 mg/dL

## 2018-12-21 LAB — LIPID PANEL
Cholesterol: 234 mg/dL — ABNORMAL HIGH (ref 0–200)
HDL: 35 mg/dL — ABNORMAL LOW (ref >40)
LDL Cholesterol: 149 mg/dL — ABNORMAL HIGH (ref 0–99)
Total CHOL/HDL Ratio: 6.7 RATIO
Triglycerides: 252 mg/dL — ABNORMAL HIGH (ref <150)
VLDL: 50 mg/dL — ABNORMAL HIGH (ref 0–40)

## 2018-12-21 LAB — TSH: TSH: 0.081 u[IU]/mL — ABNORMAL LOW (ref 0.350–4.500)

## 2018-12-21 LAB — MRSA PCR SCREENING: MRSA by PCR: NEGATIVE

## 2018-12-21 LAB — TROPONIN I (HIGH SENSITIVITY): Troponin I (High Sensitivity): 33 ng/L — ABNORMAL HIGH (ref <18)

## 2018-12-21 MED ORDER — HYDRALAZINE HCL 10 MG PO TABS
10.0000 mg | ORAL_TABLET | Freq: Four times a day (QID) | ORAL | Status: DC
  Administered 2018-12-21 – 2018-12-22 (×6): 10 mg via ORAL
  Filled 2018-12-21 (×6): qty 1

## 2018-12-21 MED ORDER — EMTRICITABINE-TENOFOVIR AF 200-25 MG PO TABS
1.0000 | ORAL_TABLET | Freq: Every day | ORAL | Status: DC
  Administered 2018-12-21 – 2018-12-22 (×2): 1 via ORAL
  Filled 2018-12-21 (×2): qty 1

## 2018-12-21 MED ORDER — RITONAVIR 100 MG PO TABS
100.0000 mg | ORAL_TABLET | Freq: Every day | ORAL | Status: DC
  Administered 2018-12-21 – 2018-12-22 (×2): 100 mg via ORAL
  Filled 2018-12-21 (×3): qty 1

## 2018-12-21 MED ORDER — ATORVASTATIN CALCIUM 40 MG PO TABS: 40.0000 mg | ORAL_TABLET | Freq: Every day | ORAL | Status: DC

## 2018-12-21 MED ORDER — FUROSEMIDE 10 MG/ML IJ SOLN
40.0000 mg | Freq: Once | INTRAMUSCULAR | Status: AC
  Administered 2018-12-21: 12:00:00 40 mg via INTRAVENOUS
  Filled 2018-12-21: qty 4

## 2018-12-21 MED ORDER — ASPIRIN EC 81 MG PO TBEC
81.0000 mg | DELAYED_RELEASE_TABLET | Freq: Every day | ORAL | Status: DC
  Administered 2018-12-21 – 2018-12-22 (×2): 81 mg via ORAL
  Filled 2018-12-21 (×2): qty 1

## 2018-12-21 MED ORDER — ONDANSETRON HCL 4 MG/2ML IJ SOLN: 4.0000 mg | Freq: Four times a day (QID) | INTRAMUSCULAR | Status: DC | PRN

## 2018-12-21 MED ORDER — LACTATED RINGERS IV SOLN: INTRAVENOUS | Status: DC

## 2018-12-21 MED ORDER — SODIUM CHLORIDE 0.9 % IV SOLN: 250.0000 mL | INTRAVENOUS | Status: DC | PRN

## 2018-12-21 MED ORDER — SODIUM CHLORIDE 0.9% FLUSH
3.0000 mL | Freq: Two times a day (BID) | INTRAVENOUS | Status: DC
  Administered 2018-12-21 – 2018-12-22 (×3): 3 mL via INTRAVENOUS

## 2018-12-21 MED ORDER — ACETAMINOPHEN 325 MG PO TABS
650.0000 mg | ORAL_TABLET | ORAL | Status: DC | PRN
  Administered 2018-12-21: 650 mg via ORAL
  Filled 2018-12-21: qty 2

## 2018-12-21 MED ORDER — SODIUM CHLORIDE 0.9% FLUSH: 3.0000 mL | INTRAVENOUS | Status: DC | PRN

## 2018-12-21 MED ORDER — DOLUTEGRAVIR SODIUM 50 MG PO TABS
50.0000 mg | ORAL_TABLET | Freq: Two times a day (BID) | ORAL | Status: DC
  Administered 2018-12-21 – 2018-12-22 (×2): 50 mg via ORAL
  Filled 2018-12-21 (×3): qty 1

## 2018-12-21 MED ORDER — NORTRIPTYLINE HCL 25 MG PO CAPS
25.0000 mg | ORAL_CAPSULE | Freq: Every day | ORAL | Status: DC
  Administered 2018-12-21: 22:00:00 25 mg via ORAL
  Filled 2018-12-21 (×2): qty 1

## 2018-12-21 MED ORDER — DOXAZOSIN MESYLATE 2 MG PO TABS
2.0000 mg | ORAL_TABLET | Freq: Every day | ORAL | Status: DC
  Administered 2018-12-21 – 2018-12-22 (×2): 2 mg via ORAL
  Filled 2018-12-21 (×2): qty 1

## 2018-12-21 MED ORDER — AMLODIPINE BESYLATE 10 MG PO TABS
10.0000 mg | ORAL_TABLET | Freq: Every day | ORAL | Status: DC
  Administered 2018-12-21 – 2018-12-22 (×2): 10 mg via ORAL
  Filled 2018-12-21 (×2): qty 1

## 2018-12-21 MED ORDER — DARUNAVIR ETHANOLATE 800 MG PO TABS
800.0000 mg | ORAL_TABLET | Freq: Every day | ORAL | Status: DC
  Administered 2018-12-21 – 2018-12-22 (×2): 800 mg via ORAL
  Filled 2018-12-21 (×3): qty 1

## 2018-12-21 MED ORDER — ATORVASTATIN CALCIUM 40 MG PO TABS
40.0000 mg | ORAL_TABLET | Freq: Every day | ORAL | Status: DC
  Administered 2018-12-21 – 2018-12-22 (×2): 40 mg via ORAL
  Filled 2018-12-21 (×2): qty 1

## 2018-12-21 MED ORDER — NITROGLYCERIN IN D5W 200-5 MCG/ML-% IV SOLN
0.0000 ug/min | INTRAVENOUS | Status: DC
  Administered 2018-12-21: 11:00:00 10 ug/min via INTRAVENOUS

## 2018-12-21 NOTE — Progress Notes (Addendum)
Pt came from [facility] with chest pains, p/s 7-8/10, Pt appears comfortable and states its a constant pain , it dosent change has iv nitroglycerin infusing @3mls / hr . Dr Lorin Mercy to come and review.

## 2018-12-21 NOTE — Consult Note (Signed)
CONSULTATION NOTE   Patient Name: Joseph Quinn Date of Encounter: 12/21/2018 Cardiologist: Unknown (I saw once in 03/2018)  Chief Complaint   Chest pain  Patient Profile   53 yo male with HIV, CKD 4, CAD and prior MI in 2014 and PCI in 2014/2018 as well as chronic diastolic HF, presented to [facility] with chest pain and referred to [facility] for cath.  HPI   Joseph Quinn is a 53 y.o. male who is being seen today for the evaluation of chest pain at the request of Dr. Myna Hidalgo. Joseph Quinn is a 53 y.o. male who I last saw in consultation in 03/2018 for chest pain. He has a history of coronary artery disease, HIV/AIDS, hypertension, dyslipidemia stage IV chronic kidney disease and chronic diastolic heart failure who was transferred to [facility] from [facility] for possible heart catheterization.  He presented to [facility] with chest pain which is similar to his pain in 2018 when he underwent stent to the LAD in [facility]. He underwent myoview stress testing last year with normal perfusion and low normal LVEF. Troponin was flat elevated. I felt that risk of contrast nephropathy was high given his CKD and did not recommend LHC given a low risk myoview and flat troponin elevation. He now developed chest pain at rest while eating sunflower seedsw - apparently took 3 nitros without relief and rates the pain 7-9/10 in intensity. Initial troponin at [facility] was negative. COVID testing was negative. EKG from [facility] personally reviewed and demonstrates sinus bradycardia at 58 with NS T wave changes. He reports the nitro gtts has not helped much.  Other remarkable lab findings including creatinine of 3.1 (GFR 26), potassium 5.7, proBNP 2260.  PMHx   Past Medical History:  Diagnosis Date  . CAD (coronary artery disease)   . Chronic bronchitis ([place])   . Chronic diastolic heart failure ([place])   . CKD (chronic kidney disease), stage IV ([place])   . HIV (human immunodeficiency virus  infection) ([place])   . Hyperlipidemia   . Hypertension   . Myocardial infarction [facility]) 2014   stents in 2014, 2018  . OSA on CPAP     Past Surgical History:  Procedure Laterality Date  . CORONARY ANGIOPLASTY WITH STENT PLACEMENT  2014   Archie Endo 04/17/2018    FAMHx   Family History  Problem Relation Age of Onset  . Heart attack Father        past away around 78, h/o MI after age 30    SOCHx    reports that he has been smoking cigarettes. He has a 10.00 pack-year smoking history. He has never used smokeless tobacco. He reports previous alcohol use. He reports that he does not use drugs.  Outpatient Medications   No current facility-administered medications on file prior to encounter.    Current Outpatient Medications on File Prior to Encounter  Medication Sig Dispense Refill  . amLODipine (NORVASC) 10 MG tablet Take 10 mg by mouth daily.     Marland Kitchen aspirin EC 81 MG tablet Take 81 mg by mouth daily.    Marland Kitchen atorvastatin (LIPITOR) 40 MG tablet Take 1 tablet (40 mg total) by mouth daily at 6 PM. 30 tablet 0  . camphor-menthol (SARNA) lotion Apply 1 application topically daily. Limit 1 bottle/month.    . carvedilol (COREG) 6.25 MG tablet Take 6.25 mg by mouth every 12 (twelve) hours.     . cholecalciferol (VITAMIN D) 1000 units tablet Take 3,000 Units by mouth daily.    Marland Kitchen  clopidogrel (PLAVIX) 75 MG tablet Take 75 mg by mouth daily.    . darunavir (PREZISTA) 800 MG tablet Take 800 mg by mouth daily. Take with Norvir and with a full meal.    . dolutegravir (TIVICAY) 50 MG tablet Take 50 mg by mouth 2 (two) times daily.    Marland Kitchen doxazosin (CARDURA) 2 MG tablet Take 2 mg by mouth daily.    Marland Kitchen emtricitabine-tenofovir AF (DESCOVY) 200-25 MG tablet Take 1 tablet by mouth daily. Take with Prezcobix.    . hydrALAZINE (APRESOLINE) 10 MG tablet Take 10 mg by mouth 4 (four) times daily.    . hydrocerin (EUCERIN) CREA Apply 1 application topically daily. Limit 120 grams/month.    . isosorbide mononitrate  (IMDUR) 30 MG 24 hr tablet Take 30 mg by mouth daily.    . nitroGLYCERIN (NITROSTAT) 0.4 MG SL tablet Place 0.4 mg under the tongue every 5 (five) minutes as needed for chest pain.    . nortriptyline (PAMELOR) 25 MG capsule Take 25 mg by mouth at bedtime.    . ritonavir (NORVIR) 100 MG TABS tablet Take 100 mg by mouth daily. Take with Prezista and with a full meal daily.      Inpatient Medications    Scheduled Meds:   Continuous Infusions:   PRN Meds:    ALLERGIES   Allergies  Allergen Reactions  . Sulfa Antibiotics     Pt states it made him go into coma    ROS   Pertinent items noted in HPI and remainder of comprehensive ROS otherwise negative.  Vitals   Vitals:   12/21/18 0853  BP: (!) 178/98  Pulse: 61  Resp: 15  Temp: 98.1 F (36.7 C)  TempSrc: Oral  SpO2: 99%   No intake or output data in the 24 hours ending 12/21/18 1009 There were no vitals filed for this visit.  Physical Exam   General appearance: alert, no distress and lying supine, restrained by ankle cuff on the bed with corrections officer present Neck: JVD - 3 cm above sternal notch, no carotid bruit and thyroid not enlarged, symmetric, no tenderness/mass/nodules Lungs: diminished breath sounds bilaterally and rales bibasilar Heart: regular rate and rhythm Abdomen: soft, non-tender; bowel sounds normal; no masses,  no organomegaly Extremities: edema trace ankle bilaterally Pulses: 2+ and symmetric Skin: Skin color, texture, turgor normal. No rashes or lesions Neurologic: Mental status: Alert, oriented, thought content appropriate Psych: Pleasant  Labs   No results found for this or any previous visit (from the past 48 hour(s)).  ECG   Sinus brady at 58, NS T wave changes - Personally Reviewed  Telemetry   Sinus rhythm - Personally Reviewed  Radiology   No results found.  Cardiac Studies   N/A  Impression   Principal Problem:   Chest pain Active Problems:   HIV (human  immunodeficiency virus infection) ([place])   Hypertension   Coronary artery disease   CKD (chronic kidney disease) stage 3, GFR 30-59 ml/min (HCC)   Recommendation   1. Chest pain is atypical for angina, occurred at rest, not improved with nitro.  There is also reproducible chest wall pain on exam. Initial troponin is negative. ProBNP is elevated -there does appear to be some pulmonary edema on exam.  I do not feel this is unstable angina. Will hold on IV Heparin for now. Can likely wean off nitro gtts. Check 2D echo today, suspect diastolic CHF. Check portable CXR given crackles. Will give single dose IV lasix today and  monitor for improvement. Stop lactated ringers. Trend troponins - if they rise, treat for NSTEMI. Not an ideal cath candidate given low GFR - would need more significant evidence for ischemia/infarct before risk/benefit ratio would favor cath.  Thanks for the consult. Cardiology will follow with you.  Time Spent Directly with Patient:  I have spent a total of 45 minutes with the patient reviewing hospital notes, telemetry, EKGs, labs and examining the patient as well as establishing an assessment and plan that was discussed personally with the patient.  > 50% of time was spent in direct patient care.  Length of Stay:  LOS: 0 days   Pixie Casino, MD, [facility], [place] Director of the Advanced Lipid Disorders &  Cardiovascular Risk Reduction Clinic Diplomate of the American Board of Clinical Lipidology Attending Cardiologist  Direct Dial: [phone]  Fax: [phone]  Website:  [email or web address]   Nadean Corwin Cordarrell Sane 12/21/2018, 10:09 AM

## 2018-12-21 NOTE — H&P (Signed)
History and Physical    Joseph Quinn WUJ:[id number] DOB: 05-Oct-1965 DOA: 12/21/2018  PCP: Patient, No Pcp Per - has one in [place] Consultants:  [place], [place]; Cardiology in [place] Patient coming from: [place]; Diaz: Mother, [phone]  Chief Complaint: chest pain  HPI: Joseph Quinn is a 53 y.o. male with medical history significant of HTN, HLD, HIV, stage 3 CKD, CAD with stents (2014, 2018), and chronic diastolic CHF (grade 2 in 78/46) who presented to [facility] with chest pain.  He is coming from the [employer].  Last night about 8pm, he was eating sunflower seeds and he developed severe chest pain like something was sitting on his chest.    Nothing made it better, sometimes it felt worse when he tried to get up or move around.  He took NTG x 3 without relief.  This was similar to prior index symptoms.  It improved a bit with pain medication.  The pain is still present.  Substernal-left upper chest.  Currently it is 7/10, at its worst it was 9/10.  No associated SOB.  No diaphoresis.  +nausea.  He thinks last cath was while in prison, maybe 2018.    He was last admitted from jail in 10/19 with chest pain.  He had a low-risk myoview at that time.    ED Course: Carryover per Dr. Myna Hidalgo:  He is afebrile with SBP 180 and vitals otherwise normal. CBC normal. Chem panel with creatinine 3.1 (was 2.6 in October `19) and potassium 5.7.  Initial troponin 0.05. CXR "no active disease." EKG "normal" per Dr. Dina Rich. COVID is negative.   He received ASA and SL NTG en route, and then nitro paste, morphine, and fentanyl in ED. Continues to have "8/10" pain though appears comfortable per EDP, and is being started on NTG infusion. Cardiology there believes he will likely need to be cathed and recommended admit here.   Review of Systems: As per HPI; otherwise review of systems reviewed and negative.   Ambulatory Status:  Ambulates without assistance  Past Medical History:   Diagnosis Date  . CAD (coronary artery disease)   . Chronic bronchitis ([place])   . Chronic diastolic heart failure ([place])   . CKD (chronic kidney disease), stage IV ([place])   . HIV (human immunodeficiency virus infection) ([place])   . Hyperlipidemia   . Hypertension   . Myocardial infarction [facility]) 2014   stents in 2014, 2018  . OSA on CPAP     Past Surgical History:  Procedure Laterality Date  . CORONARY ANGIOPLASTY WITH STENT PLACEMENT  2014   Archie Endo 04/17/2018    Social History   Socioeconomic History  . Marital status: Single    Spouse name: Not on file  . Number of children: Not on file  . Years of education: Not on file  . Highest education level: Not on file  Occupational History  . Occupation: prison  Social Needs  . Financial resource strain: Not on file  . Food insecurity    Worry: Not on file    Inability: Not on file  . Transportation needs    Medical: Not on file    Non-medical: Not on file  Tobacco Use  . Smoking status: Current Some Day Smoker    Packs/day: 0.50    Years: 20.00    Pack years: 10.00    Types: Cigarettes  . Smokeless tobacco: Never Used  Substance and Sexual Activity  . Alcohol use: Not Currently    Comment: none  in prison  . Drug use: No  . Sexual activity: Not on file  Lifestyle  . Physical activity    Days per week: Not on file    Minutes per session: Not on file  . Stress: Not on file  Relationships  . Social Herbalist on phone: Not on file    Gets together: Not on file    Attends religious service: Not on file    Active member of club or organization: Not on file    Attends meetings of clubs or organizations: Not on file    Relationship status: Not on file  . Intimate partner violence    Fear of current or ex partner: Not on file    Emotionally abused: Not on file    Physically abused: Not on file    Forced sexual activity: Not on file  Other Topics Concern  . Not on file  Social History Narrative   He was  convicted on 07/28/16 for felony robbery with possession of a firearm by a felon and is projected to be released on 06/16/19.   Prior convictions were in 2012 for possession of drug paraphernalia;  2009 for possession of schedule 2 narcotic; 2008 for selling schedule 2 narcotics; 2007 for violation of a protective order.    Allergies  Allergen Reactions  . Sulfa Antibiotics     Pt states it made him go into coma    Family History  Problem Relation Age of Onset  . Heart attack Father        past away around 78, h/o MI after age 58    Prior to Admission medications   Medication Sig Start Date End Date Taking? Authorizing Provider  amLODipine (NORVASC) 10 MG tablet Take 10 mg by mouth daily.     [provider]  aspirin EC 81 MG tablet Take 81 mg by mouth daily.    [provider]  atorvastatin (LIPITOR) 40 MG tablet Take 1 tablet (40 mg total) by mouth daily at 6 PM. 04/18/18 05/18/18  Amin, Jeanella Flattery, MD  camphor-menthol [facility]) lotion Apply 1 application topically daily. Limit 1 bottle/month.    [provider]  carvedilol (COREG) 6.25 MG tablet Take 6.25 mg by mouth every 12 (twelve) hours.     [provider]  cholecalciferol (VITAMIN D) 1000 units tablet Take 3,000 Units by mouth daily.    [provider]  clopidogrel (PLAVIX) 75 MG tablet Take 75 mg by mouth daily.    [provider]  darunavir (PREZISTA) 800 MG tablet Take 800 mg by mouth daily. Take with Norvir and with a full meal.    [provider]  dolutegravir (TIVICAY) 50 MG tablet Take 50 mg by mouth 2 (two) times daily.    [provider]  doxazosin (CARDURA) 2 MG tablet Take 2 mg by mouth daily.    [provider]  emtricitabine-tenofovir AF (DESCOVY) 200-25 MG tablet Take 1 tablet by mouth daily. Take with Prezcobix.    [provider]  hydrALAZINE (APRESOLINE) 10 MG tablet Take 10 mg by mouth 4 (four) times daily.    [provider]  hydrocerin (EUCERIN) CREA Apply 1 application topically daily. Limit 120 grams/month.    [provider]  isosorbide mononitrate (IMDUR) 30 MG 24 hr tablet Take 30 mg by mouth daily.    [provider]  nitroGLYCERIN (NITROSTAT) 0.4 MG SL tablet Place 0.4 mg under the tongue every 5 (five) minutes  as needed for chest pain.    [provider]  nortriptyline (PAMELOR) 25 MG capsule Take 25 mg by mouth at bedtime.    [provider]  ritonavir (NORVIR) 100 MG TABS tablet Take 100 mg by mouth daily. Take with Prezista and with a full meal daily.    [provider]    Physical Exam: Vitals:   12/21/18 0853  BP: (!) 178/98  Pulse: 61  Resp: 15  Temp: 98.1 F (36.7 C)  TempSrc: Oral  SpO2: 99%     . General:  Appears calm and comfortable and is NAD . Eyes:  PERRL, EOMI, normal lids, iris . ENT:  grossly normal hearing, lips & tongue, mmm; appropriate dentition . Neck:  no LAD, masses or thyromegaly; no carotid bruits . Cardiovascular:  RRR, no m/r/g. No LE edema.  Marland Kitchen Respiratory:   CTA bilaterally with no wheezes/rales/rhonchi.  Normal respiratory effort. . Abdomen:  soft, NT, ND, NABS . Back:   normal alignment, no CVAT . Skin:  no rash or induration seen on limited exam . Musculoskeletal:  grossly normal tone BUE/BLE, good ROM, no bony abnormality . Lower extremity:  No LE edema.  Limited foot exam with no ulcerations.  2+ distal pulses. Marland Kitchen Psychiatric:  grossly normal mood and affect, speech fluent and appropriate, AOx3 . Neurologic:  CN 2-12 grossly intact, moves all extremities in coordinated fashion, sensation intact    Radiological Exams on Admission: No results found.  EKG: at [facility] with no evidence of acute ischemia; EKG here is pending   Labs on Admission: I have personally reviewed the available labs and imaging studies at the time of the admission.  Pertinent labs at [facility]:   K+ 2.7 BUN 50/Creatinine 3.10/GFR 26  Trop 0.05, 0.06, 0.05 WBC 10.5 Hgb 12.3 INR 1.0 Pro-BNP 2260 COVID negative  Assessment/Plan Principal Problem:   Chest pain Active Problems:   HIV (human immunodeficiency virus infection) (HCC)   Acute kidney injury superimposed on CKD (HCC)   Dyslipidemia   Hypertension   Coronary artery disease   CKD (chronic kidney disease) stage 3, GFR 30-59 ml/min (HCC)   Tobacco abuse   Chest pain with h/o CAD -Patient with substernal CP that developed at rest last night, has been persistent with some waxing and waning since onset. -Marginally improved with NTG/pain medication -Worse with exertion -Reports this is similar to prior index symptoms -1-2/3 typical symptoms suggestive of noncardiac vs. Atypical cardiac chest pain.  -CXR unremarkable at [facility].   -Initial cardiac troponins flat at [facility].  -EKG not indicative of acute ischemia at [facility]; repeat is pending.   -HEART score is 5, indicating that the patient has an elevated risk score and requires further evaluation. -Will plan to place in observation status on telemetry to rule out ACS by overnight observation.  -HS troponin x 2 ordered -Repeat EKG now and in AM -Patient is on ASA and Plavix (also Imdur) - will hold Plavix and Imdur pending cardiology evaluation and decision about whether to perform cardiac cath today. -Risk factor stratification with HgbA1c and FLP; will also check TSH and UDS -Cardiology consultation for possible cath - had negative MPS in 10/19 -Will continue NTG drip now, but defer to cardiology about whether to continue -Start heparin per pharmacy for now, but defer to cardiology about necessity  HTN -Continue Norvasc -Hold Coreg for now due to bradycardia (in 50s throughout my evaluation), but will defer to cardiology  HLD -Continue Lipitor -Check lipids; LD was 94  in 10/19  AKI on CKD -This may be his new baseline, as creatinine 1.56 in 9/18 and 2.60 in 10/19 -For now, gentle IVF hydration -Recheck BMP in  AM -Likely needs K+ repletion but will recheck labs first  HIV -Suboptimal control in 9/18 with CD4 100 and VL 1540 -Recheck CD4 and VL now -Continue HIV meds -Outpatient/prison f/u  Tobacco dependence -As noted below, patch not offered to patient as per [employer]   Note: This patient has been tested and is negative for the novel coronavirus COVID-19.   Note: As I was leaving the room, I was accompanied by his Designer, industrial/product who noted 2 points - 1- He is currently a ward of the state and so I am not allowed to contact any family member with information about his condition or admission to the hospital. 2- Tobacco is not allowed on state property and so if the patient is using cigarettes it is without permission.  As such, it is inappropriate to provide him with a nicotine patch.    DVT prophylaxis: Heparin Code Status:  FULL Family Communication: None present - prisoner, as above Disposition Plan:  Jail Consults called: Cardiology Admission status: It is my clinical opinion that referral for OBSERVATION is reasonable and necessary in this patient based on the above information provided. The aforementioned taken together are felt to place the patient at high risk for further clinical deterioration. However it is anticipated that the patient may be medically stable for discharge from the hospital within 24 to 48 hours.     Karmen Bongo MD [facility]   How to contact the [facility] Attending or Consulting provider [place] or covering provider during after hours [place], for this patient?  1. Check the care team in [facility] and look for a) attending/consulting TR[facility] provider listed and b) the [facility] team listed 2. Log into [email or web address] and use 's universal password to access. If you do not have the password, please contact the hospital operator. 3. Locate the [facility] provider you are looking for under [facility] and page to a number that you can be directly  reached. 4. If you still have difficulty reaching the provider, please page the [facility] (Director on Call) for the Hospitalists listed on amion for assistance.   12/21/2018, 10:18 AM

## 2018-12-22 DIAGNOSIS — R072 Precordial pain: Secondary | ICD-10-CM

## 2018-12-22 DIAGNOSIS — R0789 Other chest pain: Secondary | ICD-10-CM | POA: Diagnosis not present

## 2018-12-22 LAB — BASIC METABOLIC PANEL
Anion gap: 8 (ref 5–15)
BUN: 52 mg/dL — ABNORMAL HIGH (ref 6–20)
CO2: 21 mmol/L — ABNORMAL LOW (ref 22–32)
Calcium: 7.8 mg/dL — ABNORMAL LOW (ref 8.9–10.3)
Chloride: 111 mmol/L (ref 98–111)
Creatinine, Ser: 4.08 mg/dL — ABNORMAL HIGH (ref 0.61–1.24)
GFR calc Af Amer: 18 mL/min — ABNORMAL LOW (ref >60)
GFR calc non Af Amer: 16 mL/min — ABNORMAL LOW (ref >60)
Glucose, Bld: 99 mg/dL (ref 70–99)
Potassium: 5.5 mmol/L — ABNORMAL HIGH (ref 3.5–5.1)
Sodium: 140 mmol/L (ref 135–145)

## 2018-12-22 LAB — PROTIME-INR
INR: 1.1 (ref 0.8–1.2)
Prothrombin Time: 13.6 seconds (ref 11.4–15.2)

## 2018-12-22 LAB — CBC
HCT: 33.5 % — ABNORMAL LOW (ref 39.0–52.0)
Hemoglobin: 11.2 g/dL — ABNORMAL LOW (ref 13.0–17.0)
MCH: 30.9 pg (ref 26.0–34.0)
MCHC: 33.4 g/dL (ref 30.0–36.0)
MCV: 92.3 fL (ref 80.0–100.0)
Platelets: 125 10*3/uL — ABNORMAL LOW (ref 150–400)
RBC: 3.63 MIL/uL — ABNORMAL LOW (ref 4.22–5.81)
RDW: 14.1 % (ref 11.5–15.5)
WBC: 7.2 10*3/uL (ref 4.0–10.5)
nRBC: 0 % (ref 0.0–0.2)

## 2018-12-22 NOTE — Plan of Care (Signed)
discharged

## 2018-12-22 NOTE — Progress Notes (Signed)
Report given to San Jetty, RN at [facility] of corrections.   Emelda Fear, RN

## 2018-12-22 NOTE — Discharge Summary (Addendum)
Physician Discharge Summary  Joni Norrod LNL:[id number] DOB: 01-20-1966 DOA: 12/21/2018  PCP: Patient, No Pcp Per  Admit date: 12/21/2018 Discharge date: 12/22/2018  Admitted From: Jail Disposition: Jail  Recommendations for Outpatient Follow-up:  1. Follow up with PCP in 1-2 weeks 2. Please obtain BMP/CBC in one week  Discharge Condition: Stable CODE STATUS: Full Diet recommendation: Cardiac diet low-salt, low-fat  Brief/Interim Summary: Ramello Cordial is a 53 y.o. male with medical history significant of HTN, HLD, HIV, stage 3 CKD, CAD with stents (2014, 2018), and chronic diastolic CHF (grade 2 in 40/81) who presented to [facility] with chest pain.  He is coming from the [facility].  Last night about 8pm, he was eating sunflower seeds and he developed severe chest pain like something was sitting on his chest.    Nothing made it better, sometimes it felt worse when he tried to get up or move around.  He took NTG x 3 without relief.  This was similar to prior index symptoms.  It improved a bit with pain medication.  The pain is still present.  Substernal-left upper chest.  Currently it is 7/10, at its worst it was 9/10.  No associated SOB.  No diaphoresis.  +nausea.  He thinks last cath was while in prison, maybe 2018. He was last admitted from jail in 10/19 with chest pain.  He had a low-risk myoview at that time. He is afebrile with SBP 180 and vitals otherwise normal. CBC normal. Chem panel with creatinine 3.1 (was 2.6 in October `19) and potassium 5.7.  Initial troponin 0.05. CXR "no active disease." EKG "normal" per Dr. Dina Rich. COVID is negative. He received ASA and SL NTG en route, and then nitro paste, morphine, and fentanyl in ED. Continues to have "8/10" pain though appears comfortable per EDP, and is being started on NTG infusion. Cardiology there believes he will likely need to be cathed and recommended admit here.   Patient admitted as above for atypical chest pain,  evaluated by cardiology, no further treatment indicated at this time - echo as below.  Chest pain resolved - reproducible with palpation, not affected by nitro, EKG unremarkable.  Recommending further outpatient recommendations and work-up with primary PCP and cardiology.  Discharge Diagnoses:  Principal Problem:   Chest pain Active Problems:   HIV (human immunodeficiency virus infection) ([place])   Acute kidney injury superimposed on CKD (HCC)   Dyslipidemia   Hypertension   Coronary artery disease   CKD (chronic kidney disease) stage 3, GFR 30-59 ml/min (HCC)   Tobacco abuse  Discharge Instructions Follow-up with PCP, outpatient cardiology as indicated.  Take all medications as described.  Allergies as of 12/22/2018      Reactions   Sulfa Antibiotics Other (See Comments)   Pt states it made him go into coma      Medication List    TAKE these medications   amLODipine 10 MG tablet Commonly known as: NORVASC Take 10 mg by mouth daily.   aspirin EC 81 MG tablet Take 81 mg by mouth daily.   atorvastatin 40 MG tablet Commonly known as: LIPITOR Take 1 tablet (40 mg total) by mouth daily at 6 PM.   carvedilol 6.25 MG tablet Commonly known as: COREG Take 6.25 mg by mouth every 12 (twelve) hours.   cholecalciferol 1000 units tablet Commonly known as: VITAMIN D Take 3,000 Units by mouth daily.   clopidogrel 75 MG tablet Commonly known as: PLAVIX Take 75 mg by mouth daily.  darunavir 800 MG tablet Commonly known as: PREZISTA Take 800 mg by mouth daily. Take with Norvir and with a full meal.   dolutegravir 50 MG tablet Commonly known as: TIVICAY Take 50 mg by mouth 2 (two) times daily.   doxazosin 2 MG tablet Commonly known as: CARDURA Take 2 mg by mouth daily.   emtricitabine-tenofovir AF 200-25 MG tablet Commonly known as: DESCOVY Take 1 tablet by mouth daily. Take with Prezcobix.   hydrALAZINE 10 MG tablet Commonly known as: APRESOLINE Take 10 mg by mouth 4  (four) times daily.   nitroGLYCERIN 0.4 MG SL tablet Commonly known as: NITROSTAT Place 0.4 mg under the tongue every 5 (five) minutes as needed for chest pain.   ritonavir 100 MG Tabs tablet Commonly known as: NORVIR Take 100 mg by mouth daily. Take with Prezista and with a full meal daily.       Allergies  Allergen Reactions  . Sulfa Antibiotics Other (See Comments)    Pt states it made him go into coma    Consultations:  Cardiology   Procedures/Studies: Study Conclusions  - Left ventricle: The cavity size was normal. Wall thickness was   normal. Systolic function was normal. The estimated ejection   fraction was in the range of 60% to 65%. Wall motion was normal;   there were no regional wall motion abnormalities. Features are   consistent with a pseudonormal left ventricular filling pattern,   with concomitant abnormal relaxation and increased filling   pressure (grade 2 diastolic dysfunction).  Dg Chest Port 1 View  Result Date: 12/21/2018 CLINICAL DATA:  Left-sided chest pain. EXAM: PORTABLE CHEST 1 VIEW COMPARISON:  Chest x-ray from yesterday. FINDINGS: Stable cardiomediastinal silhouette. Normal pulmonary vascularity. No focal consolidation, pleural effusion, or pneumothorax. No acute osseous abnormality. IMPRESSION: No active disease. Electronically Signed   By: Titus Dubin M.D.   On: 12/21/2018 12:01    Subjective: No acute issues or events overnight, chest pain now resolved, somewhat reproduced with palpation today but otherwise patient feels back to baseline, stable and otherwise agreeable for discharge.   Discharge Exam: Vitals:   12/22/18 0900 12/22/18 0923  BP:  (!) 149/78  Pulse:  75  Resp: 12 13  Temp:    SpO2:  95%   Vitals:   12/22/18 0755 12/22/18 0757 12/22/18 0900 12/22/18 0923  BP: (!) 159/100 (!) 157/94  (!) 149/78  Pulse: 69 68  75  Resp: 14 13 12 13   Temp: 98.5 F (36.9 C)     TempSrc: Oral     SpO2: 98% 99%  95%  Weight:       Height:        General:  Pleasantly resting in bed, No acute distress. HEENT:  Normocephalic atraumatic.  Sclerae nonicteric, noninjected.  Extraocular movements intact bilaterally. Neck:  Without mass or deformity.  Trachea is midline. Lungs:  Clear to auscultate bilaterally without rhonchi, wheeze, or rales. Heart:  Regular rate and rhythm.  Without murmurs, rubs, or gallops. Abdomen:  Soft, nontender, nondistended.  Without guarding or rebound. Extremities: Without cyanosis, clubbing, edema, or obvious deformity. Vascular:  Dorsalis pedis and posterior tibial pulses palpable bilaterally. Skin:  Warm and dry, no erythema, no ulcerations.   The results of significant diagnostics from this hospitalization (including imaging, microbiology, ancillary and laboratory) are listed below for reference.     Microbiology: Recent Results (from the past 240 hour(s))  MRSA PCR Screening     Status: None   Collection Time: 12/21/18  11:18 AM   Specimen: Nasal Mucosa; Nasopharyngeal  Result Value Ref Range Status   MRSA by PCR NEGATIVE NEGATIVE Final    Comment:        The GeneXpert MRSA Assay (FDA approved for NASAL specimens only), is one component of a comprehensive MRSA colonization surveillance program. It is not intended to diagnose MRSA infection nor to guide or monitor treatment for MRSA infections. Performed at [facility], [place] [address] [place], [place] [address]      Labs: BNP (last 3 results) Recent Labs    12/21/18 1104  BNP 169.6*   Basic Metabolic Panel: Recent Labs  Lab 12/21/18 1104 12/22/18 0204  NA 142 140  K 5.8* 5.5*  CL 116* 111  CO2 19* 21*  GLUCOSE 94 99  BUN 45* 52*  CREATININE 3.37* 4.08*  CALCIUM 8.2* 7.8*   Liver Function Tests: Recent Labs  Lab 12/21/18 1104  AST 13*  ALT 11  ALKPHOS 69  BILITOT 0.8  PROT 5.4*  ALBUMIN 2.9*   No results for input(s): LIPASE, AMYLASE in the last 168 hours. No results for input(s): AMMONIA  in the last 168 hours. CBC: Recent Labs  Lab 12/21/18 1104 12/22/18 0204  WBC 8.7 7.2  NEUTROABS 5.4  --   HGB 11.8* 11.2*  HCT 35.4* 33.5*  MCV 91.5 92.3  PLT 149* 125*   Cardiac Enzymes: No results for input(s): CKTOTAL, CKMB, CKMBINDEX, TROPONINI in the last 168 hours. BNP: Invalid input(s): POCBNP CBG: No results for input(s): GLUCAP in the last 168 hours. D-Dimer No results for input(s): DDIMER in the last 72 hours. Hgb A1c Recent Labs    12/21/18 1104  HGBA1C 5.2   Lipid Profile Recent Labs    12/21/18 1104  CHOL 234*  HDL 35*  LDLCALC 149*  TRIG 252*  CHOLHDL 6.7   Thyroid function studies Recent Labs    12/21/18 1104  TSH 0.081*   Anemia work up No results for input(s): VITAMINB12, FOLATE, FERRITIN, TIBC, IRON, RETICCTPCT in the last 72 hours. Urinalysis No results found for: COLORURINE, APPEARANCEUR, [place], [place], [place], [place], [place], [place], PROTEINUR, UROBILINOGEN, NITRITE, LEUKOCYTESUR Sepsis Labs Invalid input(s): PROCALCITONIN,  WBC,  LACTICIDVEN Microbiology Recent Results (from the past 240 hour(s))  MRSA PCR Screening     Status: None   Collection Time: 12/21/18 11:18 AM   Specimen: Nasal Mucosa; Nasopharyngeal  Result Value Ref Range Status   MRSA by PCR NEGATIVE NEGATIVE Final    Comment:        The GeneXpert MRSA Assay (FDA approved for NASAL specimens only), is one component of a comprehensive MRSA colonization surveillance program. It is not intended to diagnose MRSA infection nor to guide or monitor treatment for MRSA infections. Performed at [facility], [place] [address] [place], [place] [address]    Time coordinating discharge: Over 30 minutes  SIGNED:  Little Ishikawa, DO [facility] 12/22/2018, 1:32 PM Pager   If 7PM-7AM, please contact night-coverage [email or web address] Password [email or web address]

## 2018-12-22 NOTE — Progress Notes (Signed)
Telemetry discontinued at this time. CCMD notified.

## 2018-12-22 NOTE — Progress Notes (Addendum)
Nitro drip stopped at this time. No c/o chest pain.

## 2018-12-22 NOTE — Progress Notes (Signed)
Progress Note  Patient Name: Joseph Quinn Date of Encounter: 12/22/2018  Primary Cardiologist: Dr Debara Pickett  Subjective   Complains of nausea.  Chest pain resolved.  Inpatient Medications    Scheduled Meds: . amLODipine  10 mg Oral Daily  . aspirin EC  81 mg Oral Daily  . atorvastatin  40 mg Oral q1800  . darunavir  800 mg Oral Q breakfast  . dolutegravir  50 mg Oral BID  . doxazosin  2 mg Oral Daily  . emtricitabine-tenofovir AF  1 tablet Oral Daily  . hydrALAZINE  10 mg Oral QID  . nortriptyline  25 mg Oral QHS  . ritonavir  100 mg Oral Q breakfast  . sodium chloride flush  3 mL Intravenous Q12H   Continuous Infusions: . sodium chloride    . nitroGLYCERIN 10 mcg/min (12/22/18 0658)   PRN Meds: sodium chloride, acetaminophen, ondansetron (ZOFRAN) IV, sodium chloride flush   Vital Signs    Vitals:   12/22/18 0700 12/22/18 0755 12/22/18 0757 12/22/18 0923  BP:  (!) 159/100 (!) 157/94 (!) 149/78  Pulse:  69 68 75  Resp: 12 14 13 13   Temp:  98.5 F (36.9 C)    TempSrc:  Oral    SpO2:  98% 99% 95%  Weight:      Height:        Intake/Output Summary (Last 24 hours) at 12/22/2018 0931 Last data filed at 12/22/2018 0658 Gross per 24 hour  Intake 516 ml  Output -  Net 516 ml   Last 3 Weights 12/21/2018  Weight (lbs) 187 lb 9.8 oz  Weight (kg) 85.1 kg  Some encounter information is confidential and restricted. Go to Review Flowsheets activity to see all data.      Telemetry    Sinus- Personally Reviewed  Physical Exam   GEN: No acute distress.   Neck: No JVD Cardiac: RRR, no murmurs, rubs, or gallops.  Respiratory: Clear to auscultation bilaterally. GI: Soft, nontender, non-distended  MS: No edema Neuro:  Nonfocal  Psych: Normal affect   Labs    High Sensitivity Troponin:   Recent Labs  Lab 12/21/18 1104  TROPONINIHS 33*      Chemistry Recent Labs  Lab 12/21/18 1104 12/22/18 0204  NA 142 140  K 5.8* 5.5*  CL 116* 111  CO2 19* 21*   GLUCOSE 94 99  BUN 45* 52*  CREATININE 3.37* 4.08*  CALCIUM 8.2* 7.8*  PROT 5.4*  --   ALBUMIN 2.9*  --   AST 13*  --   ALT 11  --   ALKPHOS 69  --   BILITOT 0.8  --   GFRNONAA 20* 16*  GFRAA 23* 18*  ANIONGAP 7 8     Hematology Recent Labs  Lab 12/21/18 1104 12/22/18 0204  WBC 8.7 7.2  RBC 3.87* 3.63*  HGB 11.8* 11.2*  HCT 35.4* 33.5*  MCV 91.5 92.3  MCH 30.5 30.9  MCHC 33.3 33.4  RDW 14.3 14.1  PLT 149* 125*    BNP Recent Labs  Lab 12/21/18 1104  BNP 269.5*     Radiology    Dg Chest Port 1 View  Result Date: 12/21/2018 CLINICAL DATA:  Left-sided chest pain. EXAM: PORTABLE CHEST 1 VIEW COMPARISON:  Chest x-ray from yesterday. FINDINGS: Stable cardiomediastinal silhouette. Normal pulmonary vascularity. No focal consolidation, pleural effusion, or pneumothorax. No acute osseous abnormality. IMPRESSION: No active disease. Electronically Signed   By: Titus Dubin M.D.   On: 12/21/2018 12:01  Patient Profile     53 y.o. male with past medical history of HIV, chronic stage IV kidney disease, coronary artery disease with prior PCI, chronic diastolic congestive heart failure transferred from [facility] with chest pain.  Assessment & Plan    1 chest pain-patient symptoms have resolved.  However he states chest pain increases with palpation.  His cardiac enzymes are not consistent with acute coronary syndrome and electrocardiogram shows no diagnostic ST changes.  Nuclear study October 2019 showed no ischemia.  Given severity of renal insufficiency he would be high risk for contrast nephropathy.  Would therefore plan to not pursue further ischemia evaluation.  2 coronary artery disease-continue aspirin and statin.  3 hypertension-plan to continue amlodipine.  Add additional medications as needed.  4 chronic stage IV kidney disease-patient needs follow-up with nephrology.  5 low TSH-Per primary care.  [facility] will sign off.   Medication  Recommendations: Continue present cardiac medications. Other recommendations (labs, testing, etc): No further cardiac testing. Follow up as an outpatient: Follow-up Dr. Debara Pickett 6-8 weeks following discharge.  For questions or updates, please contact [place] Please consult [email or web address] for contact info under        Signed, Kirk Ruths, MD  12/22/2018, 9:31 AM

## 2018-12-22 NOTE — Progress Notes (Signed)
IV discontinued at this time

## 2018-12-22 NOTE — Plan of Care (Signed)
Continue to monitor

## 2018-12-24 LAB — HIV-1 RNA, PCR (GRAPH) RFX/GENO EDI
HIV-1 RNA BY PCR: <20 copies/mL
HIV-1 RNA Quant, Log: UNDETERMINED log10copy/mL

## 2019-01-03 IMAGING — US US RENAL
1 series · 14 of 25 positions shown · non-contrast
Comparison: None

CLINICAL DATA: Acute kidney injury, smoker

EXAM:
RENAL / URINARY TRACT ULTRASOUND COMPLETE

[Series 1: us renal · 0.26mm/px · 14 of 37 slices shown]
[im 1/37]
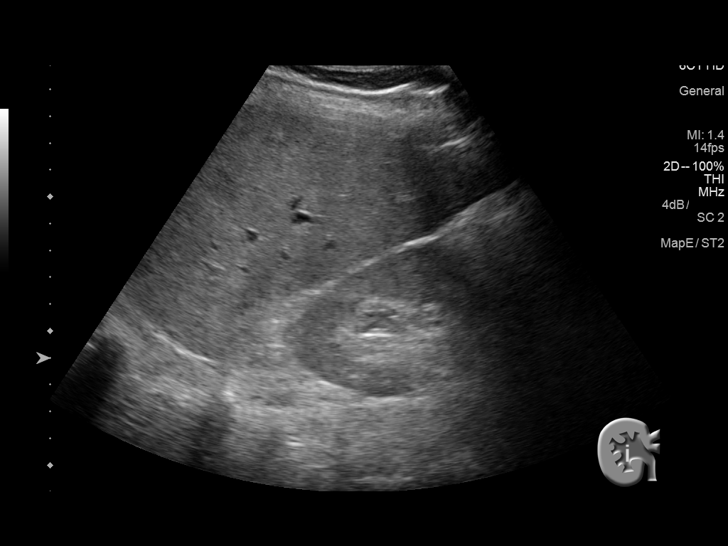
[im 4/37]
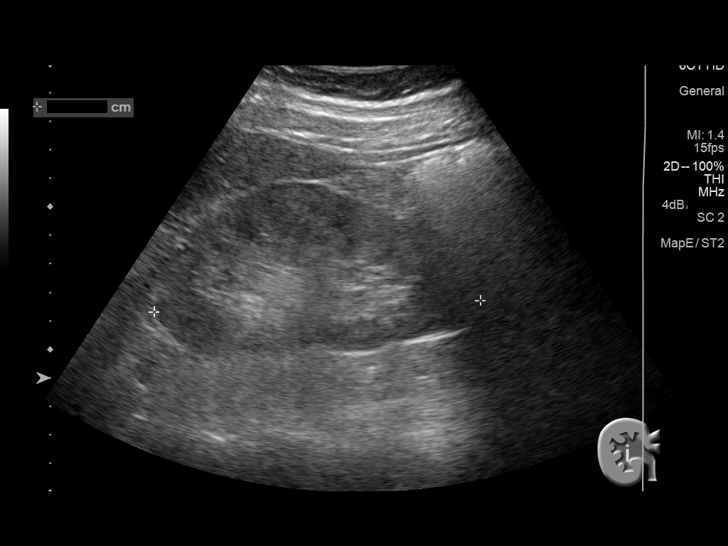
[im 7/37]
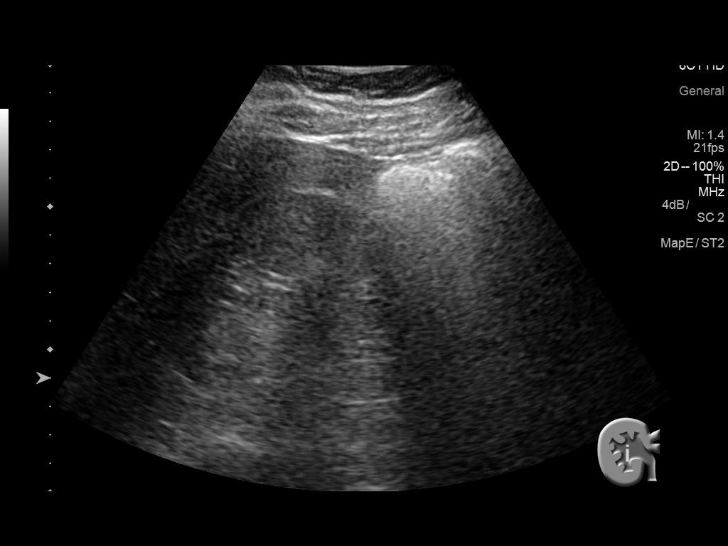
[im 10/37]
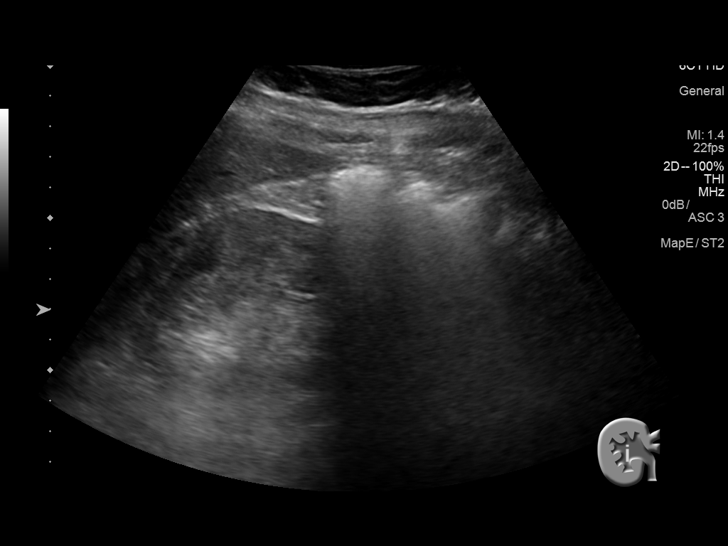
[im 13/37]
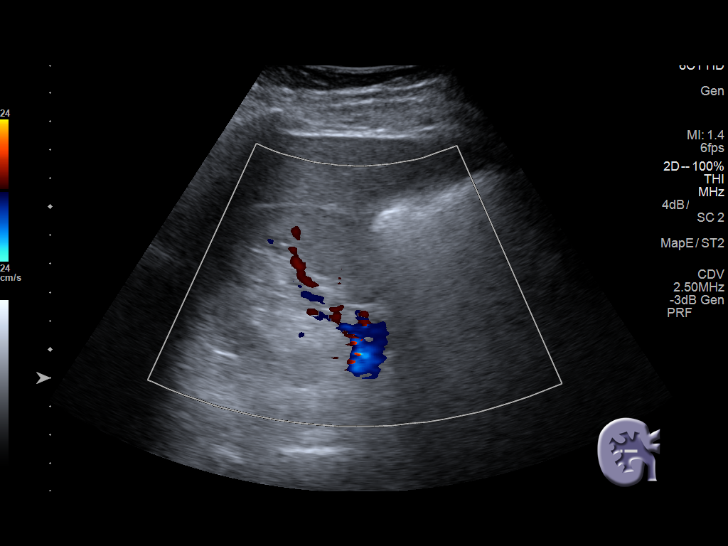
[im 14/37]
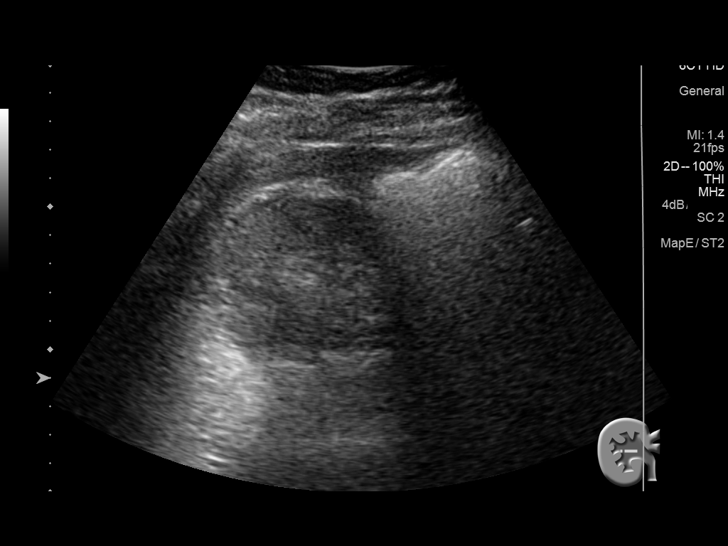
[im 17/37]
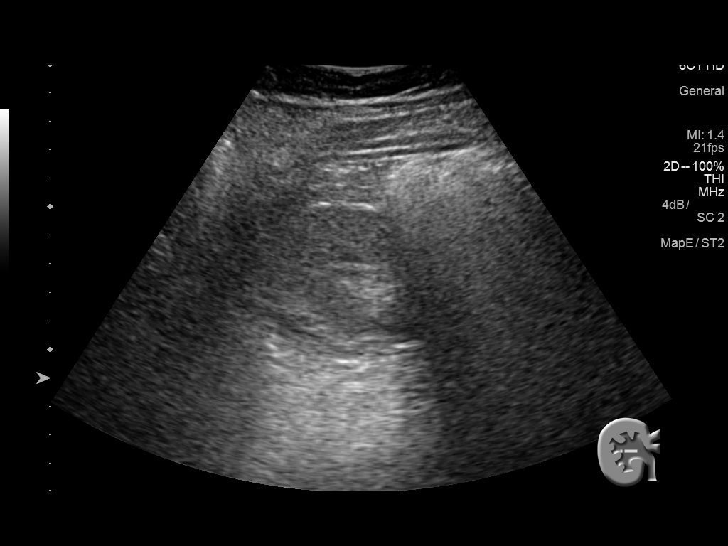
[im 20/37]
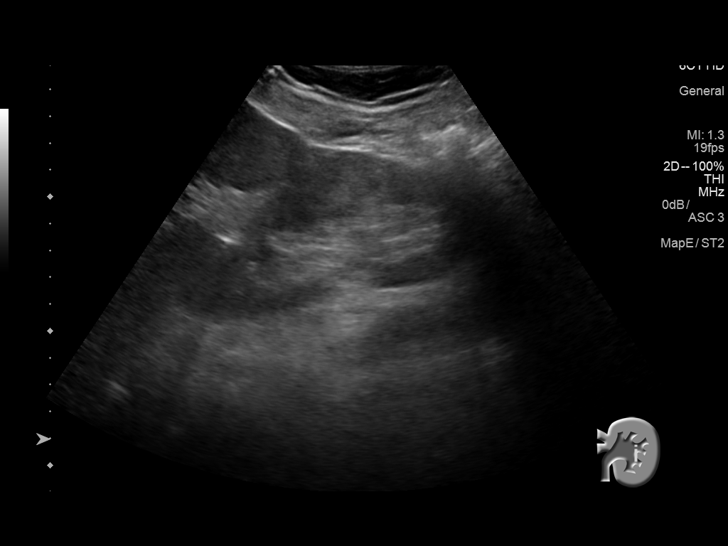
[im 23/37]
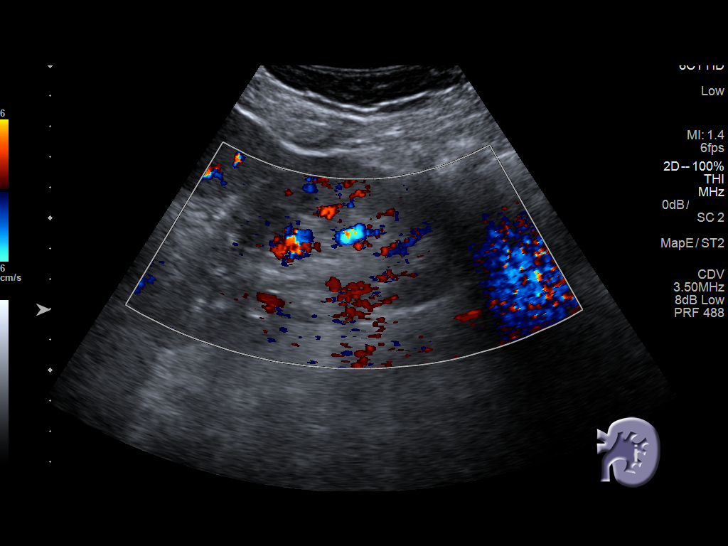
[im 25/37]
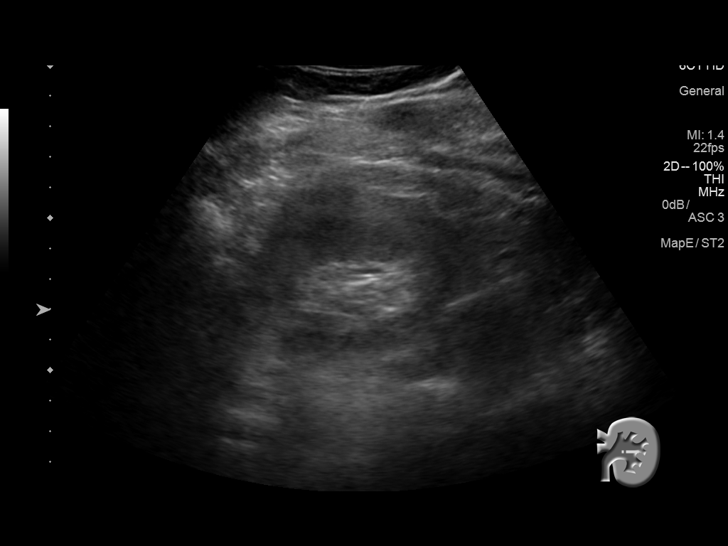
[im 28/37]
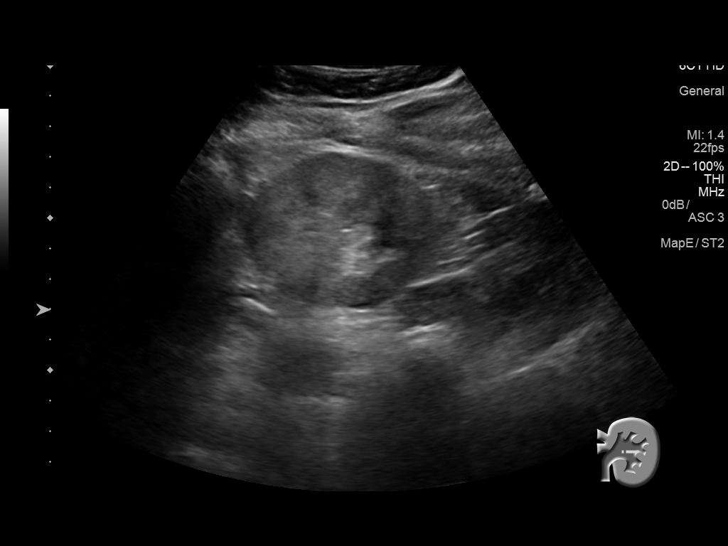
[im 31/37]
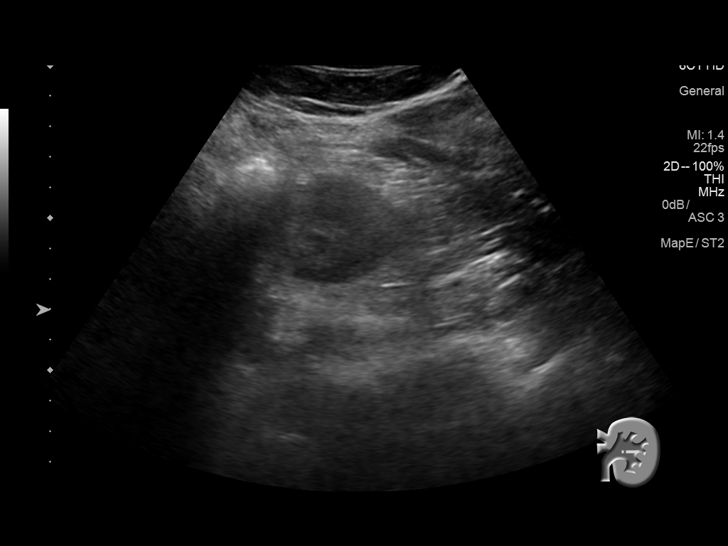
[im 34/37]
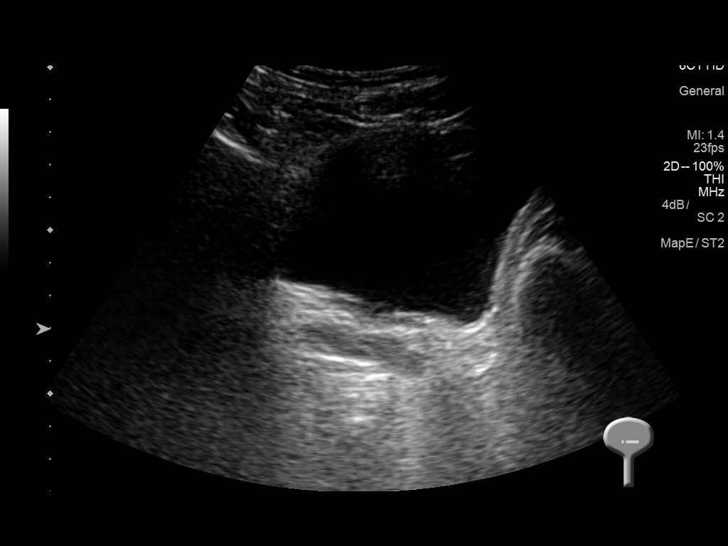
[im 37/37]
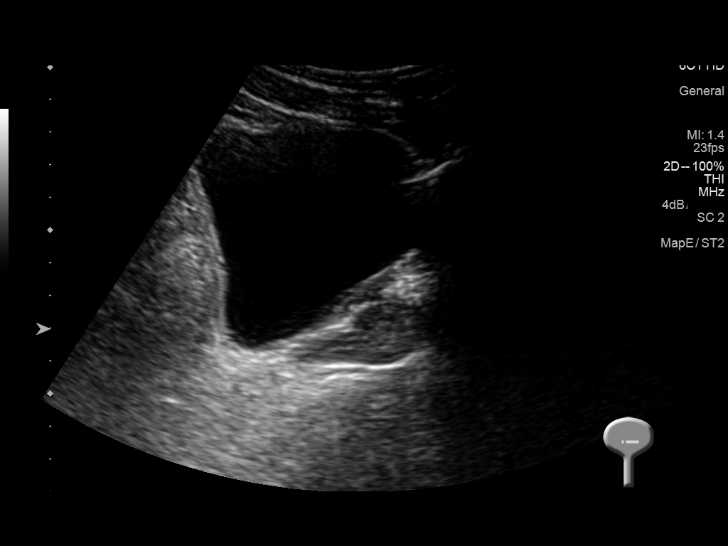

[14 of 25 positions shown; findings below may reference images not displayed]

FINDINGS: Right Kidney:

Length: 11.4 cm. Normal cortical thickness. Increased cortical
echogenicity. No mass, hydronephrosis, or shadowing calcification.

Left Kidney:

Length: 10.9 cm. Normal cortical thickness. Increased cortical
echogenicity. No mass, hydronephrosis or shadowing calcification.

Bladder:

Appears normal for degree of bladder distention.
IMPRESSION: Medical renal disease changes of both kidneys.

No evidence of renal mass or hydronephrosis.

## 2019-10-23 IMAGING — DX DG CHEST 1V PORT
1 series · 1 of 1 positions shown · non-contrast
Comparison: Portable chest x-ray April 14, 2018

CLINICAL DATA: Preoperative examination prior to cardiac
catheterization. History of left-sided chest, shoulder, and arm
pain. History of coronary artery disease and stent placement, HIV,
current smoker.

EXAM:
PORTABLE CHEST 1 VIEW

[chest]
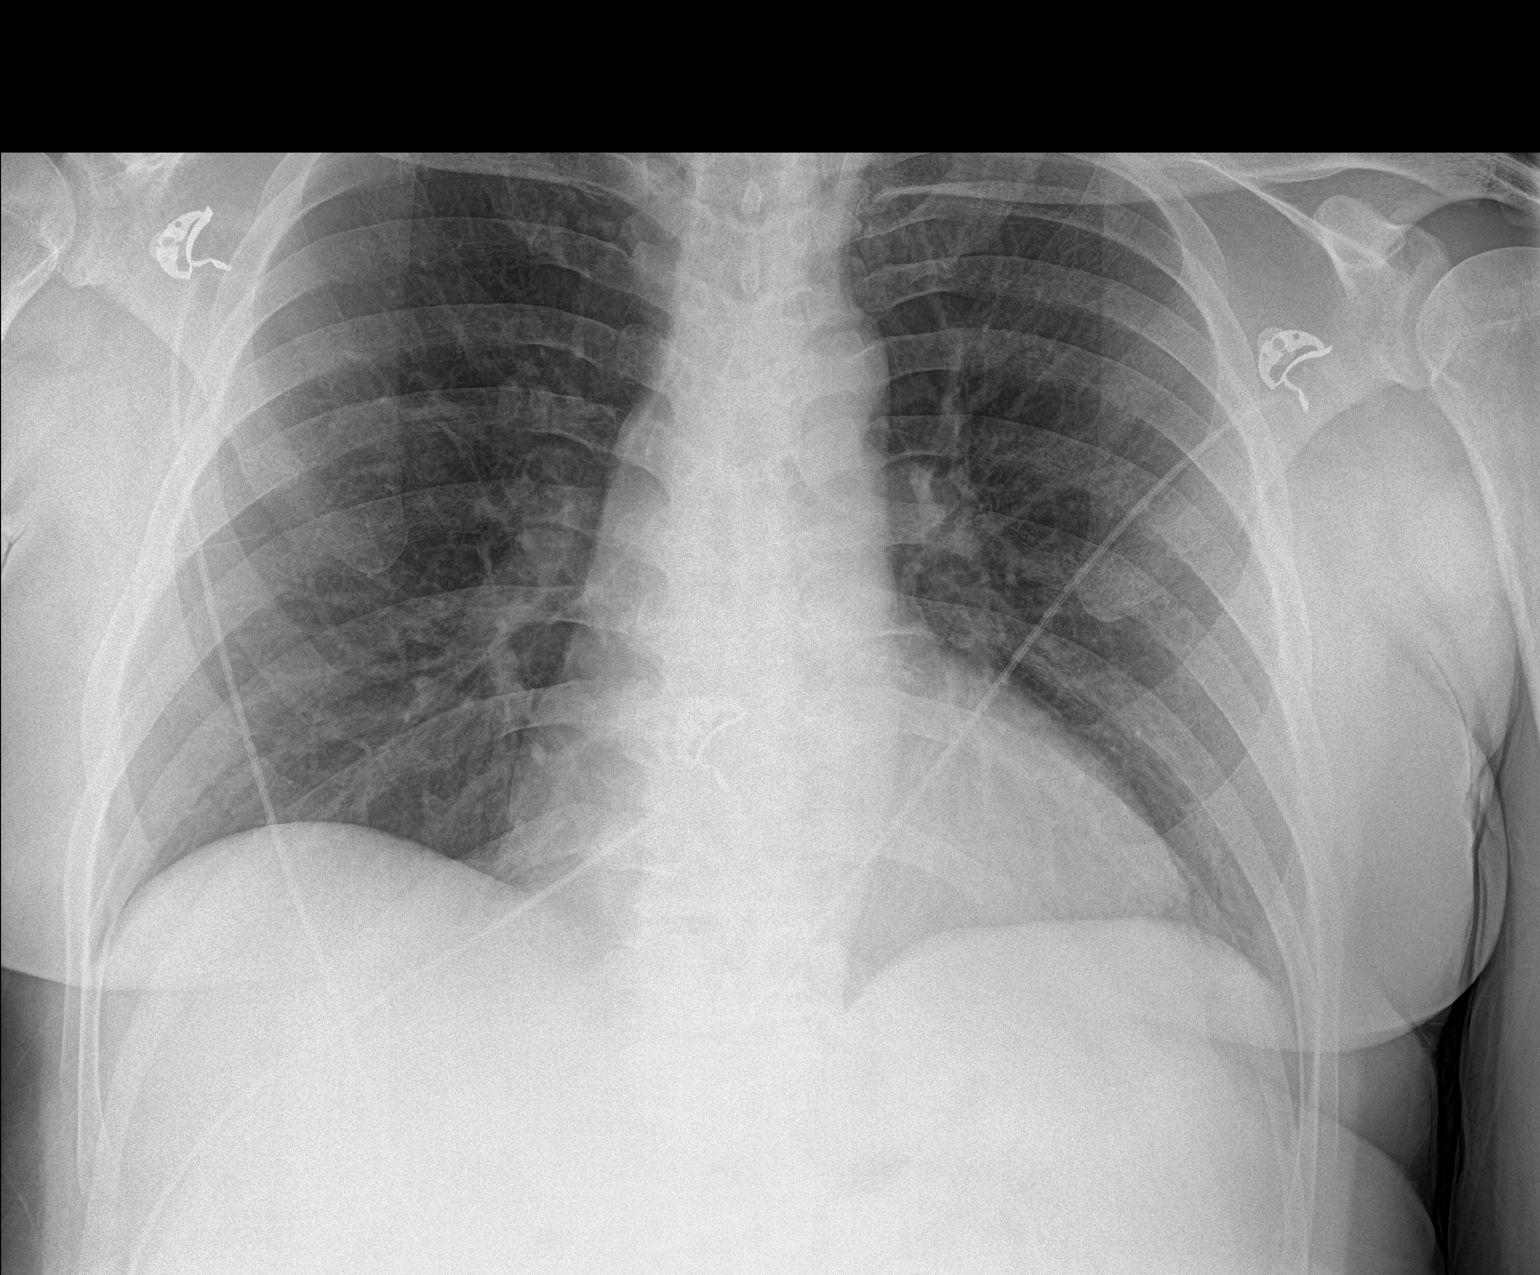

[1 of 1 positions shown; findings below may reference images not displayed]

FINDINGS: The lungs are well-expanded and clear. The heart is top-normal in
size. The pulmonary vascularity is normal. The mediastinum is normal
in width. The bony thorax exhibits no acute abnormality.
IMPRESSION: Top-normal cardiac size. No pulmonary edema nor other acute
cardiopulmonary abnormality.
